# Patient Record
Sex: Male | Born: 1960 | Race: White | Hispanic: No | Smoking: Former smoker
Health system: Southern US, Community
[De-identification: ages and names within clinical notes are randomized; demographics above are authoritative.]

## PROBLEM LIST (undated history)

## (undated) DIAGNOSIS — J439 Emphysema, unspecified: Secondary | ICD-10-CM

## (undated) DIAGNOSIS — G4730 Sleep apnea, unspecified: Secondary | ICD-10-CM

## (undated) DIAGNOSIS — S42409A Unspecified fracture of lower end of unspecified humerus, initial encounter for closed fracture: Secondary | ICD-10-CM

## (undated) DIAGNOSIS — E785 Hyperlipidemia, unspecified: Secondary | ICD-10-CM

## (undated) DIAGNOSIS — K7460 Unspecified cirrhosis of liver: Secondary | ICD-10-CM

## (undated) DIAGNOSIS — K635 Polyp of colon: Secondary | ICD-10-CM

## (undated) DIAGNOSIS — I10 Essential (primary) hypertension: Secondary | ICD-10-CM

## (undated) DIAGNOSIS — E119 Type 2 diabetes mellitus without complications: Secondary | ICD-10-CM

## (undated) DIAGNOSIS — E079 Disorder of thyroid, unspecified: Secondary | ICD-10-CM

## (undated) DIAGNOSIS — M549 Dorsalgia, unspecified: Secondary | ICD-10-CM

## (undated) HISTORY — DX: Emphysema, unspecified: J43.9

## (undated) HISTORY — DX: Polyp of colon: K63.5

## (undated) HISTORY — DX: Disorder of thyroid, unspecified: E07.9

## (undated) HISTORY — DX: Unspecified cirrhosis of liver: K74.60

## (undated) HISTORY — DX: Sleep apnea, unspecified: G47.30

## (undated) HISTORY — DX: Hyperlipidemia, unspecified: E78.5

## (undated) HISTORY — DX: Essential (primary) hypertension: I10

## (undated) HISTORY — DX: Dorsalgia, unspecified: M54.9

## (undated) HISTORY — DX: Type 2 diabetes mellitus without complications: E11.9

## (undated) HISTORY — DX: Unspecified fracture of lower end of unspecified humerus, initial encounter for closed fracture: S42.409A

---
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
## 1991-11-27 DIAGNOSIS — S42409A Unspecified fracture of lower end of unspecified humerus, initial encounter for closed fracture: Secondary | ICD-10-CM

## 1991-11-27 HISTORY — DX: Unspecified fracture of lower end of unspecified humerus, initial encounter for closed fracture: S42.409A

## 2004-02-28 ENCOUNTER — Other Ambulatory Visit: Payer: Self-pay

## 2004-11-27 ENCOUNTER — Emergency Department: Payer: Self-pay | Admitting: Emergency Medicine

## 2006-10-01 ENCOUNTER — Emergency Department: Payer: Self-pay | Admitting: Emergency Medicine

## 2008-11-26 DIAGNOSIS — K7460 Unspecified cirrhosis of liver: Secondary | ICD-10-CM

## 2008-11-26 HISTORY — DX: Unspecified cirrhosis of liver: K74.60

## 2009-05-04 ENCOUNTER — Emergency Department: Payer: Self-pay | Admitting: Emergency Medicine

## 2009-07-04 ENCOUNTER — Emergency Department: Payer: Self-pay | Admitting: Emergency Medicine

## 2012-08-18 ENCOUNTER — Other Ambulatory Visit: Payer: Self-pay

## 2012-08-18 LAB — HEPATIC FUNCTION PANEL A (ARMC)
Albumin: 4.2 g/dL (ref 3.4–5.0)
Bilirubin, Direct: 0.2 mg/dL (ref 0.00–0.20)
Bilirubin,Total: 0.6 mg/dL (ref 0.2–1.0)
Total Protein: 7.8 g/dL (ref 6.4–8.2)

## 2012-08-18 LAB — PROTIME-INR: INR: 0.9

## 2013-01-02 ENCOUNTER — Emergency Department: Payer: Self-pay | Admitting: Emergency Medicine

## 2013-11-26 HISTORY — PX: OTHER SURGICAL HISTORY: SHX169

## 2013-11-26 HISTORY — PX: COLONOSCOPY: SHX174

## 2014-02-24 DIAGNOSIS — K635 Polyp of colon: Secondary | ICD-10-CM

## 2014-02-24 HISTORY — DX: Polyp of colon: K63.5

## 2014-04-22 ENCOUNTER — Encounter: Payer: Self-pay | Admitting: Nurse Practitioner

## 2014-04-26 ENCOUNTER — Encounter: Payer: Self-pay | Admitting: Nurse Practitioner

## 2014-07-12 ENCOUNTER — Emergency Department: Payer: Self-pay | Admitting: Emergency Medicine

## 2014-07-12 LAB — URINALYSIS, COMPLETE
BLOOD: NEGATIVE
Bacteria: NONE SEEN
Bilirubin,UR: NEGATIVE
Glucose,UR: >=500 mg/dL (ref 0–75)
Ketone: NEGATIVE
Leukocyte Esterase: NEGATIVE
Nitrite: NEGATIVE
PROTEIN: NEGATIVE
Ph: 5 (ref 4.5–8.0)
Specific Gravity: 1.018 (ref 1.003–1.030)
Squamous Epithelial: <1
WBC UR: <1 /HPF (ref 0–5)

## 2014-07-12 LAB — COMPREHENSIVE METABOLIC PANEL
ALBUMIN: 3.6 g/dL (ref 3.4–5.0)
AST: 27 U/L (ref 15–37)
Alkaline Phosphatase: 80 U/L
Anion Gap: 8 (ref 7–16)
BILIRUBIN TOTAL: 0.4 mg/dL (ref 0.2–1.0)
BUN: 17 mg/dL (ref 7–18)
CREATININE: 0.96 mg/dL (ref 0.60–1.30)
Calcium, Total: 9 mg/dL (ref 8.5–10.1)
Chloride: 103 mmol/L (ref 98–107)
Co2: 26 mmol/L (ref 21–32)
Glucose: 195 mg/dL — ABNORMAL HIGH (ref 65–99)
Osmolality: 281 (ref 275–301)
Potassium: 4.1 mmol/L (ref 3.5–5.1)
SGPT (ALT): 22 U/L
Sodium: 137 mmol/L (ref 136–145)
Total Protein: 7.2 g/dL (ref 6.4–8.2)

## 2014-07-12 LAB — CBC
HCT: 48 % (ref 40.0–52.0)
HGB: 15.9 g/dL (ref 13.0–18.0)
MCH: 31.3 pg (ref 26.0–34.0)
MCHC: 33.2 g/dL (ref 32.0–36.0)
MCV: 94 fL (ref 80–100)
Platelet: 193 10*3/uL (ref 150–440)
RBC: 5.09 10*6/uL (ref 4.40–5.90)
RDW: 14.3 % (ref 11.5–14.5)
WBC: 8.5 10*3/uL (ref 3.8–10.6)

## 2014-07-12 LAB — LIPASE, BLOOD: Lipase: 104 U/L (ref 73–393)

## 2014-07-29 ENCOUNTER — Ambulatory Visit (INDEPENDENT_AMBULATORY_CARE_PROVIDER_SITE_OTHER): Payer: Medicaid Other | Admitting: General Surgery

## 2014-07-29 ENCOUNTER — Encounter: Payer: Self-pay | Admitting: General Surgery

## 2014-07-29 VITALS — BP 138/80 | HR 90 | Resp 16 | Ht 72.0 in | Wt 262.0 lb

## 2014-07-29 DIAGNOSIS — M791 Myalgia, unspecified site: Secondary | ICD-10-CM

## 2014-07-29 DIAGNOSIS — IMO0001 Reserved for inherently not codable concepts without codable children: Secondary | ICD-10-CM

## 2014-07-29 NOTE — Progress Notes (Signed)
Patient ID: Colin Allen., male   DOB: 04/11/1961, 53 y.o.   MRN: [id number]  Chief Complaint  Patient presents with  . Other    New Pt evaluation of gallstones    HPI Colin Allen. is a 53 y.o. male who presents for an evaluation of gallstones. He states he started having right sided lateral flank pain around 07/04/14. He originally thought it was a pulled muscle but the pain stayed constant. He did not improve with the use of local heat. He went to the ER on 07/12/14 for an evaluation. He had a CT of the abdomen/pelvis on 07/12/14. The pain is described as a stabbing pain. He doesn't associate any foods with the pain. The pain does radiate between the right side and back. The pain medication does help relieve the pain. He is unable to sleep on his right side. He states the pain is worse in the morning.  He was diagnosed with diabetes earlier this year and has yet been able to maintain sugars less than 200.    HPI  Past Medical History  Diagnosis Date  . Emphysema of lung   . Hypertension   . Hyperlipidemia   . Sleep apnea   . Fractured elbow 1993    right elbow  . Diabetes mellitus without complication   . Colon polyps 02/2014    found 3 polyps  . Back pain   . Thyroid disease   . Cirrhosis of liver 2010    Past Surgical History  Procedure Laterality Date  . Cyst on wrist  2015  . Colonoscopy  2015  . Leg surgery Left 1968    rod was placed to correct his foot    Family History  Problem Relation Age of Onset  . Cancer Father     lung  . Cancer Maternal Grandmother     unknown  . Cancer Paternal Grandmother     unknown  . Cancer Paternal Grandfather     unsure if it was colon or prostate    Social History History  Substance Use Topics  . Smoking status: Former Smoker -- 1.00 packs/day for 35 years    Types: Cigarettes  . Smokeless tobacco: Not on file  . Alcohol Use: No    Allergies  Allergen Reactions  . Bee Venom Swelling    Current  Outpatient Prescriptions  Medication Sig Dispense Refill  . budesonide-formoterol (SYMBICORT) 160-4.5 MCG/ACT inhaler Inhale 1 puff into the lungs 2 (two) times daily.      Marland Kitchen gemfibrozil (LOPID) 600 MG tablet Take 600 mg by mouth 2 (two) times daily before a meal.      . Insulin Aspart Prot & Aspart (NOVOLOG MIX 70/30 [place]) Inject 35 Units into the skin daily.      Marland Kitchen levothyroxine (SYNTHROID, LEVOTHROID) 200 MCG tablet Take 200 mcg by mouth daily before breakfast.      . Linaclotide (LINZESS) 290 MCG CAPS capsule Take 290 mcg by mouth daily.      Marland Kitchen lisinopril (PRINIVIL,ZESTRIL) 10 MG tablet Take 10 mg by mouth daily.      Marland Kitchen oxyCODONE-acetaminophen (PERCOCET) 7.5-325 MG per tablet Take 1 tablet by mouth every 6 (six) hours as needed for pain.      Marland Kitchen zolpidem (AMBIEN) 10 MG tablet Take 10 mg by mouth at bedtime as needed for sleep.       No current facility-administered medications for this visit.    Review of Systems Review of Systems  Constitutional:  Negative.   Respiratory: Negative.   Cardiovascular: Negative.   Gastrointestinal: Negative.     Blood pressure 138/80, pulse 90, resp. rate 16, height 6' (1.829 m), weight 262 lb (118.842 kg).  Physical Exam Physical Exam  Constitutional: He is oriented to person, place, and time. He appears well-developed and well-nourished.  Cardiovascular: Normal rate, regular rhythm and normal heart sounds.   No murmur heard. Pulmonary/Chest: Effort normal and breath sounds normal.  Abdominal: Soft. Normal appearance and bowel sounds are normal. There is tenderness (located in the right flank).  Neurological: He is alert and oriented to person, place, and time.  Skin: Skin is warm and dry.    Data Reviewed CT scan of July 12, 2014 showed calcified gallstones. Special attention to the right lateral abdominal wall shows no definable defect.  Assessment    Musculoskeletal pain.  Asymptomatic cholelithiasis.     Plan    No indication for  cholecystectomy at this time, especially with the patient with poorly controlled diabetes.  Conservative measures with local heat and time has been recommended.  All narcotic medications will need to be prescribed to his PCP.      PCP and Ref. MD: Dr. Nicky Pugh  Robert Bellow 07/30/2014, 6:51 PM

## 2014-07-29 NOTE — Patient Instructions (Signed)
Patient advised to use anti-inflammatory such as Ibuprofen for pain. He is also advised that he can use a heating pad to help with pain. The patient is aware to call back for any questions or concerns.

## 2014-07-30 DIAGNOSIS — M791 Myalgia, unspecified site: Secondary | ICD-10-CM | POA: Insufficient documentation

## 2017-12-30 ENCOUNTER — Other Ambulatory Visit: Payer: Self-pay | Admitting: Specialist

## 2017-12-30 DIAGNOSIS — J449 Chronic obstructive pulmonary disease, unspecified: Secondary | ICD-10-CM

## 2017-12-30 DIAGNOSIS — R0602 Shortness of breath: Secondary | ICD-10-CM

## 2017-12-30 DIAGNOSIS — J310 Chronic rhinitis: Secondary | ICD-10-CM

## 2018-01-22 ENCOUNTER — Ambulatory Visit: Payer: Self-pay | Attending: Specialist

## 2018-02-07 ENCOUNTER — Ambulatory Visit
Admission: RE | Admit: 2018-02-07 | Discharge: 2018-02-07 | Disposition: A | Discharge status: Home / Self Care | Payer: Medicare Other | Source: Ambulatory Visit | Attending: Specialist | Admitting: Specialist

## 2018-02-07 DIAGNOSIS — R0602 Shortness of breath: Secondary | ICD-10-CM | POA: Insufficient documentation

## 2018-02-07 DIAGNOSIS — J310 Chronic rhinitis: Secondary | ICD-10-CM | POA: Diagnosis present

## 2018-02-07 DIAGNOSIS — K8020 Calculus of gallbladder without cholecystitis without obstruction: Secondary | ICD-10-CM | POA: Insufficient documentation

## 2018-02-07 DIAGNOSIS — K760 Fatty (change of) liver, not elsewhere classified: Secondary | ICD-10-CM | POA: Insufficient documentation

## 2018-02-07 DIAGNOSIS — J449 Chronic obstructive pulmonary disease, unspecified: Secondary | ICD-10-CM | POA: Diagnosis present

## 2019-02-28 IMAGING — CT CT CHEST W/O CM
2 of 3 series · 15 of 36 positions shown, 18 images · non-contrast
Comparison: None.

CLINICAL DATA: Shortness of breath

EXAM:
CT CHEST WITHOUT CONTRAST
TECHNIQUE: Multidetector CT imaging of the chest was performed following the
standard protocol without IV contrast.

[Series 2: thorax · axial · 0.75mm/px · z∈[-570,-254]mm · 12 of 186 slices shown, 15 images]
[im 14/186  mediastinal]
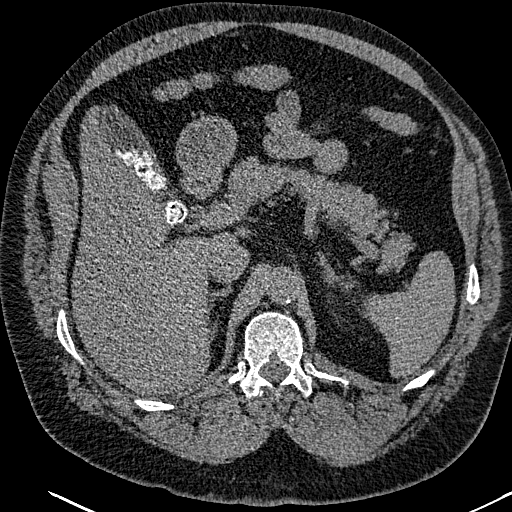
[im 14/186  lung]
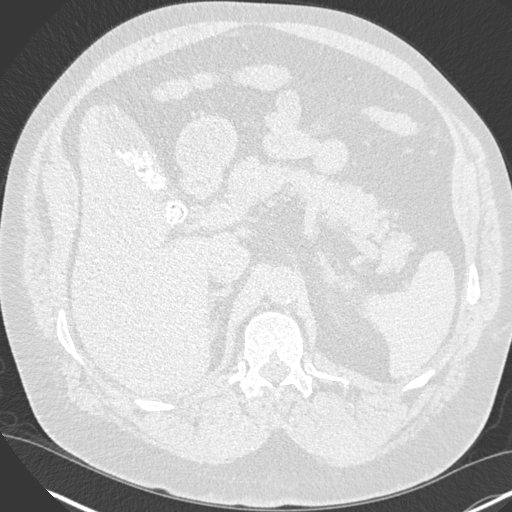
[im 28/186  lung]
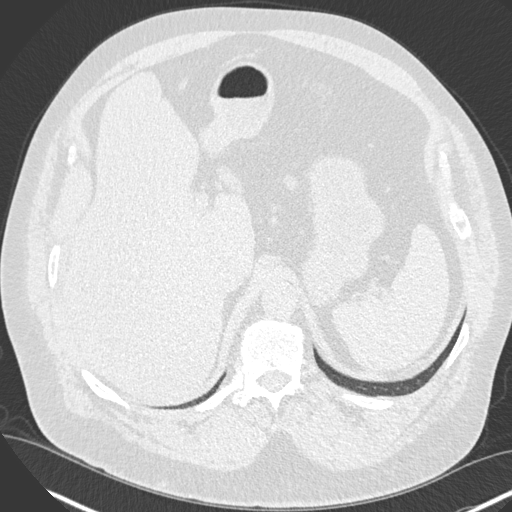
[im 42/186  lung]
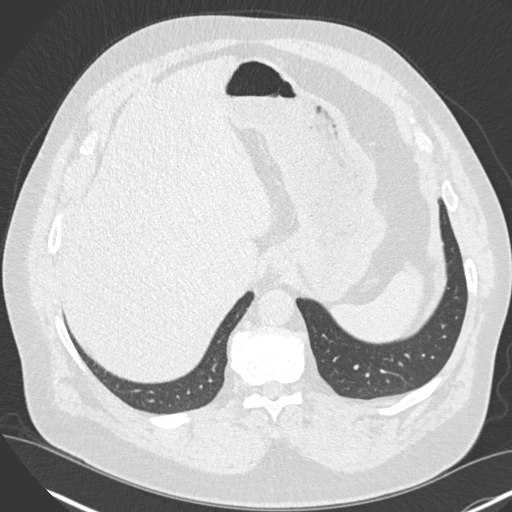
[im 55/186  lung]
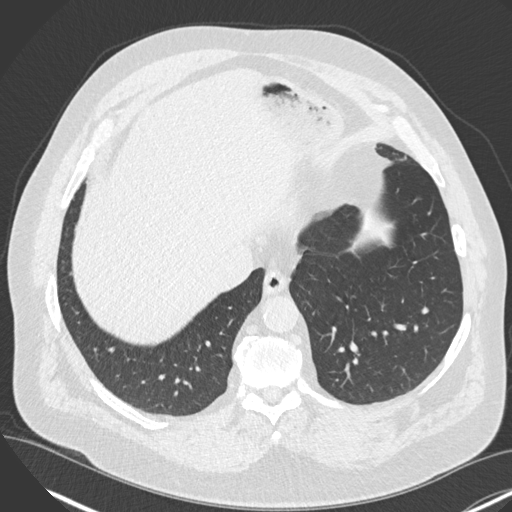
[im 69/186  mediastinal]
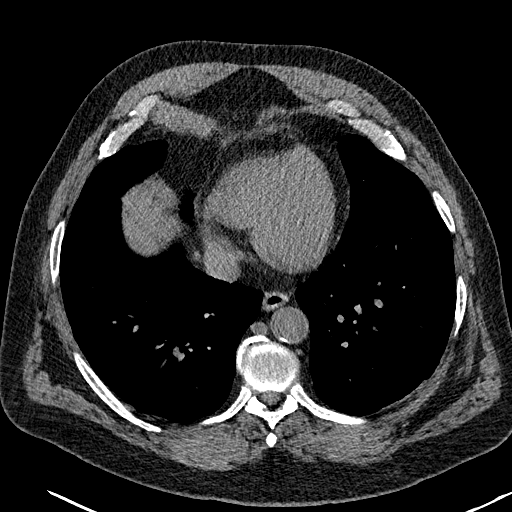
[im 69/186  lung]
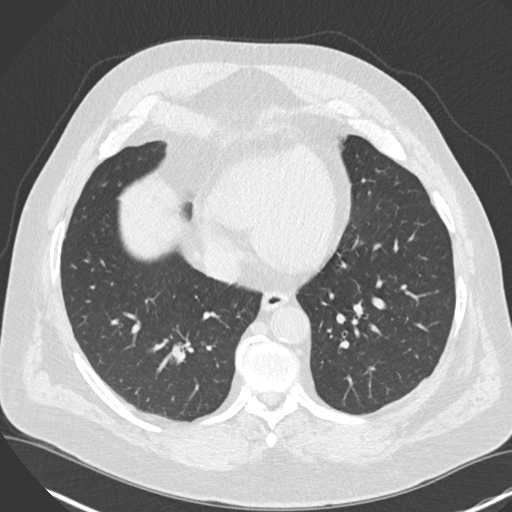
[im 83/186  lung]
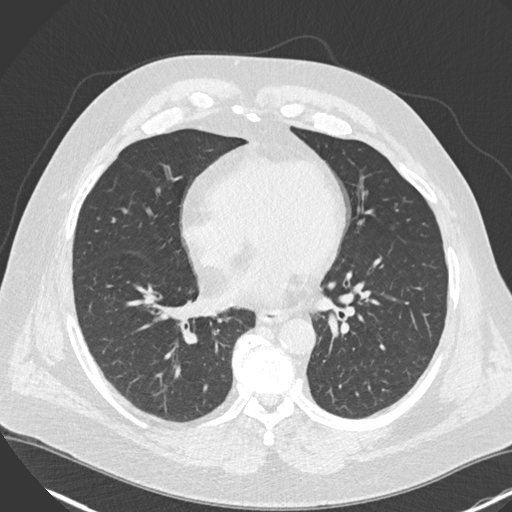
[im 103/186  lung]
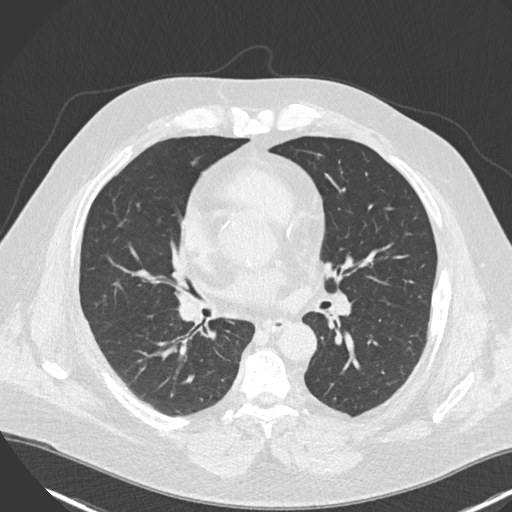
[im 117/186  lung]
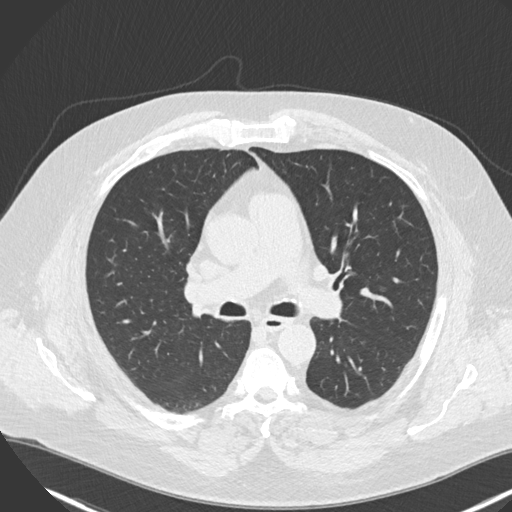
[im 131/186  mediastinal]
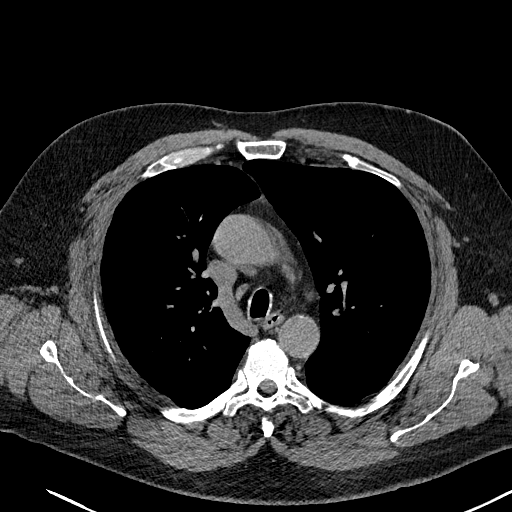
[im 131/186  lung]
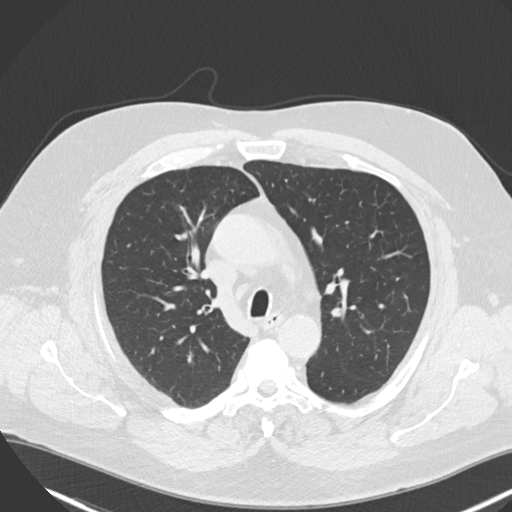
[im 144/186  lung]
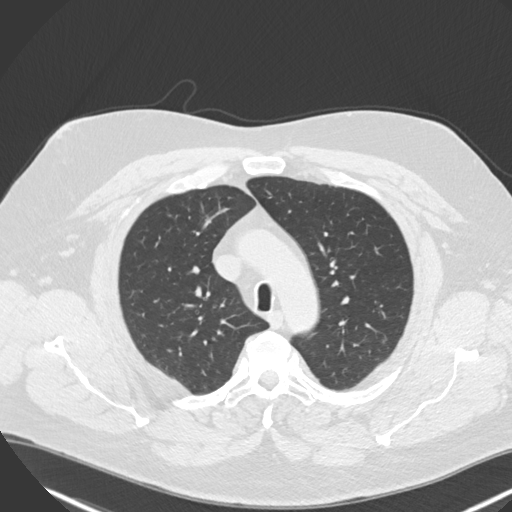
[im 158/186  lung]
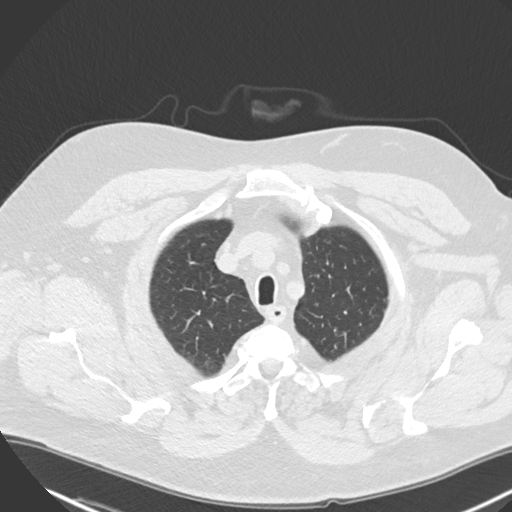
[im 172/186  lung]
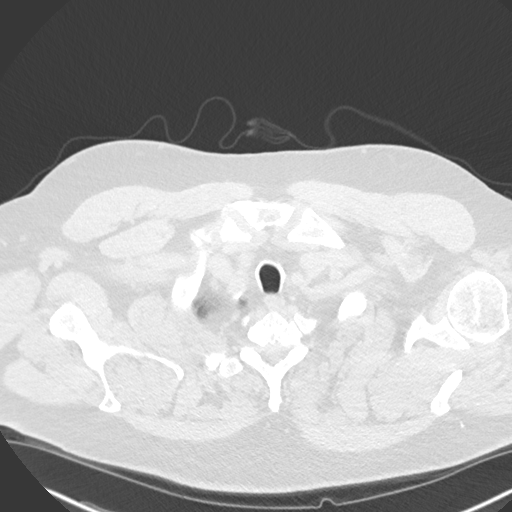

[Series 5: coronal · coronal · 0.74mm/px · 3 of 192 slices shown]
[im 39/192  lung]
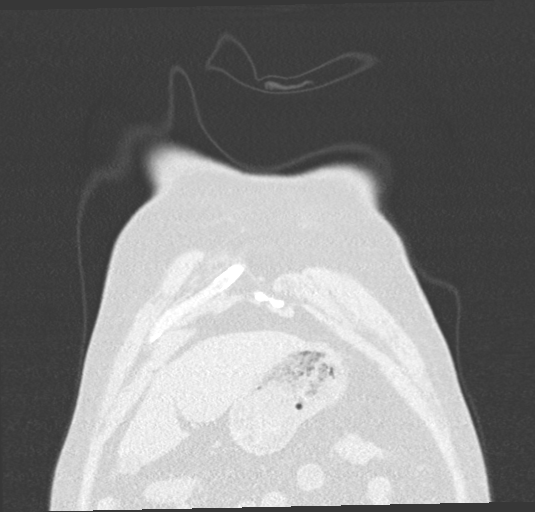
[im 77/192  lung]
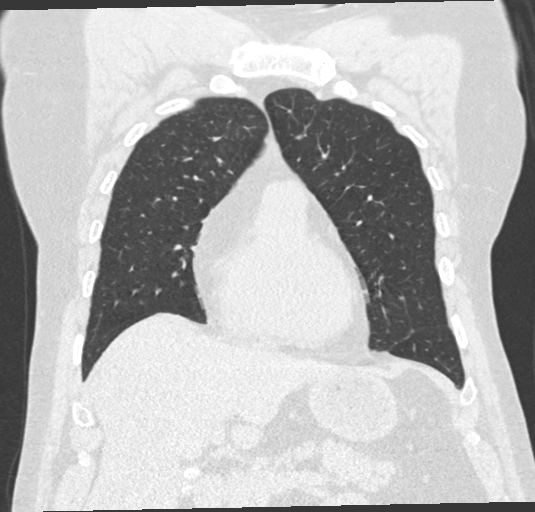
[im 115/192  lung]
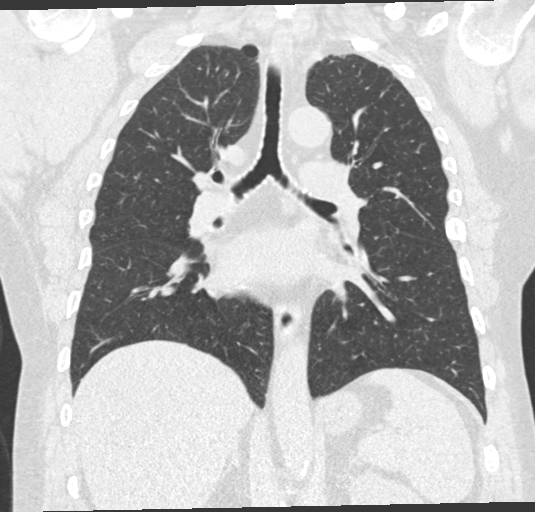

[15 of 36 positions shown; findings below may reference images not displayed]

FINDINGS: Cardiovascular: The heart size is normal. No pericardial effusion.
Coronary artery calcification is evident. Atherosclerotic
calcification is noted in the wall of the thoracic aorta.

Mediastinum/Nodes: No mediastinal lymphadenopathy. There is no hilar
lymphadenopathy. The esophagus has normal imaging features. There is
no axillary lymphadenopathy.

Lungs/Pleura: No suspicious pulmonary nodule or mass. No focal
airspace consolidation. No pulmonary edema or pleural effusion.

Upper Abdomen: Calcified gallstones measure up to about 16 mm
diameter. The liver shows diffusely decreased attenuation suggesting
steatosis.

Musculoskeletal: Bone windows reveal no worrisome lytic or sclerotic
osseous lesions.
IMPRESSION: No acute findings in the chest to explain this patient's history of
shortness of breath.

Cholelithiasis.

Hepatic steatosis.

## 2019-11-05 ENCOUNTER — Other Ambulatory Visit: Payer: Self-pay | Admitting: Endocrinology

## 2019-11-05 ENCOUNTER — Ambulatory Visit: Payer: Medicare Other

## 2019-11-05 DIAGNOSIS — K565 Intestinal adhesions [bands], unspecified as to partial versus complete obstruction: Secondary | ICD-10-CM

## 2019-11-05 DIAGNOSIS — R109 Unspecified abdominal pain: Secondary | ICD-10-CM

## 2019-11-05 DIAGNOSIS — R140 Abdominal distension (gaseous): Secondary | ICD-10-CM

## 2019-11-18 ENCOUNTER — Ambulatory Visit
Admission: RE | Admit: 2019-11-18 | Discharge: 2019-11-18 | Disposition: A | Discharge status: Home / Self Care | Payer: Medicare Other | Source: Ambulatory Visit | Attending: Endocrinology | Admitting: Endocrinology

## 2019-11-18 ENCOUNTER — Other Ambulatory Visit: Payer: Self-pay

## 2019-11-18 DIAGNOSIS — R109 Unspecified abdominal pain: Secondary | ICD-10-CM | POA: Insufficient documentation

## 2019-11-18 DIAGNOSIS — R140 Abdominal distension (gaseous): Secondary | ICD-10-CM

## 2019-11-18 DIAGNOSIS — K565 Intestinal adhesions [bands], unspecified as to partial versus complete obstruction: Secondary | ICD-10-CM

## 2019-11-18 MED ORDER — IOHEXOL 300 MG/ML  SOLN
100.0000 mL | Freq: Once | INTRAMUSCULAR | Status: AC | PRN
  Administered 2019-11-18: 100 mL via INTRAVENOUS

## 2021-10-03 ENCOUNTER — Emergency Department
Admission: EM | Admit: 2021-10-03 | Discharge: 2021-10-04 | Disposition: A | Discharge status: Other Acute Inpatient Hospital | Payer: Medicare Other | Attending: Emergency Medicine | Admitting: Emergency Medicine

## 2021-10-03 ENCOUNTER — Other Ambulatory Visit: Payer: Self-pay

## 2021-10-03 ENCOUNTER — Emergency Department: Payer: Medicare Other

## 2021-10-03 ENCOUNTER — Encounter: Payer: Self-pay | Admitting: Emergency Medicine

## 2021-10-03 DIAGNOSIS — Z87891 Personal history of nicotine dependence: Secondary | ICD-10-CM | POA: Diagnosis not present

## 2021-10-03 DIAGNOSIS — E119 Type 2 diabetes mellitus without complications: Secondary | ICD-10-CM | POA: Diagnosis not present

## 2021-10-03 DIAGNOSIS — Z79899 Other long term (current) drug therapy: Secondary | ICD-10-CM | POA: Diagnosis not present

## 2021-10-03 DIAGNOSIS — R531 Weakness: Secondary | ICD-10-CM

## 2021-10-03 DIAGNOSIS — I959 Hypotension, unspecified: Secondary | ICD-10-CM | POA: Diagnosis not present

## 2021-10-03 DIAGNOSIS — R17 Unspecified jaundice: Secondary | ICD-10-CM | POA: Diagnosis not present

## 2021-10-03 DIAGNOSIS — I10 Essential (primary) hypertension: Secondary | ICD-10-CM | POA: Insufficient documentation

## 2021-10-03 DIAGNOSIS — Z20822 Contact with and (suspected) exposure to covid-19: Secondary | ICD-10-CM | POA: Insufficient documentation

## 2021-10-03 DIAGNOSIS — R7989 Other specified abnormal findings of blood chemistry: Secondary | ICD-10-CM

## 2021-10-03 DIAGNOSIS — A419 Sepsis, unspecified organism: Secondary | ICD-10-CM | POA: Insufficient documentation

## 2021-10-03 LAB — CBC WITH DIFFERENTIAL/PLATELET
Abs Immature Granulocytes: 0.75 10*3/uL — ABNORMAL HIGH (ref 0.00–0.07)
Basophils Absolute: 0.1 10*3/uL (ref 0.0–0.1)
Basophils Relative: 0 %
Eosinophils Absolute: 0 10*3/uL (ref 0.0–0.5)
Eosinophils Relative: 0 %
HCT: 33.5 % — ABNORMAL LOW (ref 39.0–52.0)
Hemoglobin: 11.3 g/dL — ABNORMAL LOW (ref 13.0–17.0)
Immature Granulocytes: 3 %
Lymphocytes Relative: 5 %
Lymphs Abs: 1.3 10*3/uL (ref 0.7–4.0)
MCH: 32.1 pg (ref 26.0–34.0)
MCHC: 33.7 g/dL (ref 30.0–36.0)
MCV: 95.2 fL (ref 80.0–100.0)
Monocytes Absolute: 0.9 10*3/uL (ref 0.1–1.0)
Monocytes Relative: 3 %
Neutro Abs: 25.5 10*3/uL — ABNORMAL HIGH (ref 1.7–7.7)
Neutrophils Relative %: 89 %
Platelets: 190 10*3/uL (ref 150–400)
RBC: 3.52 MIL/uL — ABNORMAL LOW (ref 4.22–5.81)
RDW: 15.7 % — ABNORMAL HIGH (ref 11.5–15.5)
WBC: 28.5 10*3/uL — ABNORMAL HIGH (ref 4.0–10.5)
nRBC: 0 % (ref 0.0–0.2)

## 2021-10-03 LAB — TROPONIN I (HIGH SENSITIVITY)
Troponin I (High Sensitivity): 69 ng/L — ABNORMAL HIGH (ref <18)
Troponin I (High Sensitivity): 74 ng/L — ABNORMAL HIGH (ref <18)

## 2021-10-03 LAB — RESP PANEL BY RT-PCR (FLU A&B, COVID) ARPGX2
Influenza A by PCR: NEGATIVE
Influenza B by PCR: NEGATIVE
SARS Coronavirus 2 by RT PCR: NEGATIVE

## 2021-10-03 LAB — PROTIME-INR
INR: 1.5 — ABNORMAL HIGH (ref 0.8–1.2)
Prothrombin Time: 17.9 seconds — ABNORMAL HIGH (ref 11.4–15.2)

## 2021-10-03 LAB — LIPASE, BLOOD: Lipase: 35 U/L (ref 11–51)

## 2021-10-03 LAB — APTT: aPTT: 31 seconds (ref 24–36)

## 2021-10-03 LAB — AMMONIA: Ammonia: 51 umol/L — ABNORMAL HIGH (ref 9–35)

## 2021-10-03 LAB — BRAIN NATRIURETIC PEPTIDE: B Natriuretic Peptide: 88.7 pg/mL (ref 0.0–100.0)

## 2021-10-03 LAB — CK: Total CK: 1913 U/L — ABNORMAL HIGH (ref 49–397)

## 2021-10-03 LAB — LACTIC ACID, PLASMA: Lactic Acid, Venous: 1.8 mmol/L (ref 0.5–1.9)

## 2021-10-03 MED ORDER — SODIUM CHLORIDE 0.9 % IV BOLUS (SEPSIS)
1000.0000 mL | Freq: Once | INTRAVENOUS | Status: AC
  Administered 2021-10-03: 1000 mL via INTRAVENOUS

## 2021-10-03 MED ORDER — LACTATED RINGERS IV BOLUS
1000.0000 mL | Freq: Once | INTRAVENOUS | Status: AC
  Administered 2021-10-03: 1000 mL via INTRAVENOUS

## 2021-10-03 MED ORDER — METRONIDAZOLE 500 MG/100ML IV SOLN
500.0000 mg | Freq: Once | INTRAVENOUS | Status: AC
  Administered 2021-10-03: 500 mg via INTRAVENOUS
  Filled 2021-10-03: qty 100

## 2021-10-03 MED ORDER — LACTATED RINGERS IV SOLN: INTRAVENOUS | Status: DC

## 2021-10-03 MED ORDER — SODIUM CHLORIDE 0.9 % IV SOLN
2.0000 g | Freq: Once | INTRAVENOUS | Status: AC
  Administered 2021-10-03: 2 g via INTRAVENOUS
  Filled 2021-10-03: qty 2

## 2021-10-03 MED ORDER — ALBUMIN HUMAN 5 % IV SOLN
25.0000 g | Freq: Once | INTRAVENOUS | Status: AC
  Administered 2021-10-04: 25 g via INTRAVENOUS
  Filled 2021-10-03: qty 500

## 2021-10-03 MED ORDER — VANCOMYCIN HCL IN DEXTROSE 1-5 GM/200ML-% IV SOLN
1000.0000 mg | Freq: Once | INTRAVENOUS | Status: AC
  Administered 2021-10-03: 1000 mg via INTRAVENOUS
  Filled 2021-10-03: qty 200

## 2021-10-03 MED ORDER — BACITRACIN-NEOMYCIN-POLYMYXIN 400-5-5000 EX OINT
TOPICAL_OINTMENT | CUTANEOUS | Status: AC
  Administered 2021-10-03: 1
  Filled 2021-10-03: qty 1

## 2021-10-03 NOTE — Consult Note (Signed)
PHARMACY -  BRIEF ANTIBIOTIC NOTE   Pharmacy has received consult(s) for sepsis from an ED provider.  The patient's profile has been reviewed for ht/wt/allergies/indication/available labs.    One time order(s) placed for cefepime, vancomycin, flagyl  Further antibiotics/pharmacy consults should be ordered by admitting physician if indicated.                       Thank you, Oswald Hillock 10/03/2021  6:40 PM

## 2021-10-03 NOTE — ED Notes (Signed)
Date and time results received: 10/03/21 1829 (use smartphrase ".now" to insert current time)  Test: Total Bili  Critical Value: 29.6  Name of Provider Notified: Dr. Cinda Quest  Orders Received? Or Actions Taken?: see chart

## 2021-10-03 NOTE — ED Provider Notes (Addendum)
[facility] Emergency Department Provider Note   ____________________________________________   Event Date/Time   First MD Initiated Contact with Patient 10/03/21 1645     (approximate)  I have reviewed the triage vital signs and the nursing notes.   HISTORY  Chief Complaint Weakness    HPI Colin Allen. is a 60 y.o. male who family had not heard from him for 3 days so they called EMS.  EMS got there.  He apparently had fallen and was on the floor for an unknown length of time.  He is now very jaundiced which apparently is new.  His blood pressure is low.  He has a history of cirrhosis.  He has a history of diabetes hypertension and multiple other problems as listed in HPI.  He denies any fever nausea vomiting or pain anywhere.  He does have a skin tear on his left arm which was dressed and dressed had a little bit of pus but the wound itself looks good and there is no surrounding cellulitis. Patient's labs are abnormal there are no old labs that we have since 20 21.  Those labs are essentially within normal limits.     Past Medical History:  Diagnosis Date   Back pain    Cirrhosis of liver ([place]) 2010   Colon polyps 02/2014   found 3 polyps   Diabetes mellitus without complication ([place])    Emphysema of lung ([place])    Fractured elbow 1993   right elbow   Hyperlipidemia    Hypertension    Sleep apnea    Thyroid disease     Patient Active Problem List   Diagnosis Date Noted   Muscle pain 07/30/2014    Past Surgical History:  Procedure Laterality Date   COLONOSCOPY  2015   cyst on wrist  2015   LEG SURGERY Left 1968   rod was placed to correct his foot    Prior to Admission medications   Medication Sig Start Date End Date Taking? Authorizing Provider  budesonide-formoterol (SYMBICORT) 160-4.5 MCG/ACT inhaler Inhale 1 puff into the lungs 2 (two) times daily.    [provider]  gemfibrozil (LOPID) 600 MG tablet Take 600 mg by  mouth 2 (two) times daily before a meal.    [provider]  Insulin Aspart Prot & Aspart (NOVOLOG MIX 70/30 [place]) Inject 35 Units into the skin daily.    [provider]  levothyroxine (SYNTHROID, LEVOTHROID) 200 MCG tablet Take 200 mcg by mouth daily before breakfast.    [provider]  Linaclotide (LINZESS) 290 MCG CAPS capsule Take 290 mcg by mouth daily.    [provider]  lisinopril (PRINIVIL,ZESTRIL) 10 MG tablet Take 10 mg by mouth daily.    [provider]  oxyCODONE-acetaminophen (PERCOCET) 7.5-325 MG per tablet Take 1 tablet by mouth every 6 (six) hours as needed for pain.    [provider]  zolpidem (AMBIEN) 10 MG tablet Take 10 mg by mouth at bedtime as needed for sleep.    [provider]    Allergies Bee venom  Family History  Problem Relation Age of Onset   Cancer Father        lung   Cancer Maternal Grandmother        unknown   Cancer Paternal Grandmother        unknown   Cancer Paternal Grandfather        unsure if it was colon or prostate  Social History Social History   Tobacco Use   Smoking status: Former    Packs/day: 1.00    Years: 35.00    Pack years: 35.00    Types: Cigarettes  Substance Use Topics   Alcohol use: No   Drug use: No    Review of Systems  Constitutional: No fever/chills Eyes: No visual changes. ENT: No sore throat. Cardiovascular: Denies chest pain. Respiratory: Denies shortness of breath. Gastrointestinal: No abdominal pain.  No nausea, no vomiting.  No diarrhea.  No constipation. Genitourinary: Negative for dysuria. Musculoskeletal: Negative for back pain. Skin: Negative for rash. Neurological: Negative for headaches, focal weakness or numbness.  ____________________________________________   PHYSICAL EXAM:  VITAL SIGNS: ED Triage Vitals  Enc Vitals Group     BP 10/03/21 1829 (!) 67/45     Pulse Rate 10/03/21 1829 77     Resp 10/03/21 1713 19      Temp 10/03/21 1713 98.5 F (36.9 C)     Temp Source 10/03/21 1713 Oral     SpO2 10/03/21 1713 99 %     Weight --      Height --      Head Circumference --      Peak Flow --      Pain Score --      Pain Loc --      Pain Edu? --      Excl. in [place]? --    Constitutional: Alert and oriented. Well appearing and in no acute distress. Eyes: Conjunctivae are jaundiced PERRL. EOMI. Head: Atraumatic. Nose: No congestion/rhinnorhea. Mouth/Throat: Mucous membranes are moist.  Oropharynx non-erythematous. Neck: No stridor.  Cardiovascular: Normal rate, regular rhythm. Grossly normal heart sounds.  Good peripheral circulation. Respiratory: Normal respiratory effort.  No retractions. Lungs CTAB. Gastrointestinal: Soft and nontender. No distention. No abdominal bruits.  Musculoskeletal: No lower extremity tenderness nor edema.  Patient has bruising in multiple areas and a skin tear on the left forearm dorsal surface distally.  It is not infected looking although the dressing had pus on it when it was removed. Neurologic:  Normal speech and language. No gross focal neurologic deficits are appreciated.  Skin:  Skin is warm, dry and intact except for the skin tear as noted above.  He has a lot of bruising.   ____________________________________________   LABS (all labs ordered are listed, but only abnormal results are displayed)  Labs Reviewed  COMPREHENSIVE METABOLIC PANEL - Abnormal; Notable for the following components:      Result Value   Sodium 133 (*)    Chloride 97 (*)    CO2 19 (*)    Glucose, Bld 157 (*)    BUN 63 (*)    Calcium 7.8 (*)    Albumin 1.8 (*)    AST 561 (*)    ALT 177 (*)    Alkaline Phosphatase 501 (*)    Total Bilirubin 29.6 (*)    Anion gap 17 (*)    All other components within normal limits  CBC WITH DIFFERENTIAL/PLATELET - Abnormal; Notable for the following components:   WBC 28.5 (*)    RBC 3.52 (*)    Hemoglobin 11.3 (*)    HCT 33.5 (*)    RDW 15.7 (*)     Neutro Abs 25.5 (*)    Abs Immature Granulocytes 0.75 (*)    All other components within normal limits  PROTIME-INR - Abnormal; Notable for the following components:   Prothrombin Time 17.9 (*)    INR 1.5 (*)  All other components within normal limits  TROPONIN I (HIGH SENSITIVITY) - Abnormal; Notable for the following components:   Troponin I (High Sensitivity) 69 (*)    All other components within normal limits  CULTURE, BLOOD (SINGLE)  BRAIN NATRIURETIC PEPTIDE  APTT  LIPASE, BLOOD  LACTIC ACID, PLASMA  LACTIC ACID, PLASMA  URINALYSIS, ROUTINE W REFLEX MICROSCOPIC  AMMONIA  CK  TROPONIN I (HIGH SENSITIVITY)   ____________________________________________  EKG  EKG read interpreted by me shows normal sinus rhythm rate of 74 normal axis no acute ST-T wave changes QT interval is prolonged QTC is 506 ms ____________________________________________  RADIOLOGY Gertha Calkin, personally viewed and evaluated these images (plain radiographs) as part of my medical decision making, as well as reviewing the written report by the radiologist.  ED MD interpretation: Chest x-ray read by radiology reviewed by me shows no obvious acute disease.  Official radiology report(s): CT ABDOMEN PELVIS WO CONTRAST  Result Date: 10/03/2021 CLINICAL DATA:  Liver masses on right upper quadrant ultrasound. Flank pain with abnormal LFTs. EXAM: CT ABDOMEN AND PELVIS WITHOUT CONTRAST TECHNIQUE: Multidetector CT imaging of the abdomen and pelvis was performed following the standard protocol without IV contrast. COMPARISON:  Right upper quadrant ultrasound dated 10/03/2021. CT abdomen dated 11/18/2019. CT abdomen/pelvis dated 07/12/2014. FINDINGS: Lower chest: Trace right pleural effusion with mild bibasilar atelectasis. Hepatobiliary: Nodular hepatic contour. Innumerable hepatic lesions, including a dominant 4.4 cm mass in segment 6 (series 2/image 40), compatible with metastasis. Suspected  cholecystectomy. No intrahepatic ductal dilatation. Multiple mid/distal CBD stones measuring up to 11 mm (series 2/image 51). Pancreas: 3.3 cm irregular mass in the pancreatic tail (series 2/image 44), worrisome for pancreatic neoplasm given the additional findings. Spleen: Within normal limits. Mass abuts but does not definitely invade the spleen at the splenic hilum (series 2/image 45). Adrenals/Urinary Tract: Adrenal glands are within normal limits. Kidneys are within normal limits. No renal calculi or hydronephrosis. Bladder is within normal limits. Stomach/Bowel: Stomach is within normal limits. No evidence of bowel obstruction. Normal appendix (series 2/image 83). Left colon is decompressed. Vascular/Lymphatic: No evidence of abdominal aortic aneurysm. Atherosclerotic calcifications of the abdominal aorta and branch vessels. Small upper abdominal lymph nodes, including a 11 mm node in the porta hepatis (series 2/image 39). Reproductive: Prostate is unremarkable. Other: Trace pelvic ascites. Musculoskeletal: Degenerative changes of the visualized thoracolumbar spine. IMPRESSION: 3.3 cm irregular mass in the pancreatic tail, worrisome for pancreatic neoplasm. Innumerable metastases, measuring up to 4.4 cm in segment 6. Suspected cholecystectomy. No intrahepatic ductal dilatation. Multiple mid/distal CBD stones measuring up to 11 mm. Electronically Signed   By: Julian Hy M.D.   On: 10/03/2021 20:40   DG Chest 2 View  Result Date: 10/03/2021 CLINICAL DATA:  Altered mental status. EXAM: CHEST - 2 VIEW COMPARISON:  Chest CT February 07, 2018 FINDINGS: Patient is rotated. The heart size and mediastinal contours are within normal limits. No focal consolidation. No pleural effusion. No pneumothorax. The visualized skeletal structures are unremarkable. IMPRESSION: No acute cardiopulmonary disease. Electronically Signed   By: Dahlia Bailiff M.D.   On: 10/03/2021 18:53   CT Head Wo Contrast  Result Date:  10/03/2021 CLINICAL DATA:  Head trauma. Mental status change. Weakness. Found on floor. EXAM: CT HEAD WITHOUT CONTRAST CT CERVICAL SPINE WITHOUT CONTRAST TECHNIQUE: Multidetector CT imaging of the head and cervical spine was performed following the standard protocol without intravenous contrast. Multiplanar CT image reconstructions of the cervical spine were also generated. COMPARISON:  None.  FINDINGS: CT HEAD FINDINGS Brain: There is no evidence of an acute infarct, intracranial hemorrhage, mass, midline shift, or extra-axial fluid collection. 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 Cerebral atrophy is mild-to-moderate for age. Vascular: Calcified atherosclerosis at the skull base. No hyperdense vessel. Skull: No fracture or suspicious osseous lesion. Sinuses/Orbits: Small volume left sphenoid sinus fluid. Clear mastoid air cells. Unremarkable orbits. Other: None. CT CERVICAL SPINE FINDINGS Alignment: Normal. Skull base and vertebrae: No acute fracture or suspicious osseous lesion. Soft tissues and spinal canal: No prevertebral fluid or swelling. No visible canal hematoma. Disc levels:  Mild cervical spondylosis and facet arthrosis. Upper chest: Mild scarring in the lung apices. Other: Moderate calcific atherosclerosis at the carotid bifurcations. IMPRESSION: 1. No evidence of acute intracranial abnormality. 2.  Cerebral Atrophy (ICD10-G31.9). 3. No acute cervical spine fracture or traumatic subluxation. Electronically Signed   By: Logan Bores M.D.   On: 10/03/2021 18:55   CT Cervical Spine Wo Contrast  Result Date: 10/03/2021 CLINICAL DATA:  Head trauma. Mental status change. Weakness. Found on floor. EXAM: CT HEAD WITHOUT CONTRAST CT CERVICAL SPINE WITHOUT CONTRAST TECHNIQUE: Multidetector CT imaging of the head and cervical spine was performed following the standard protocol without intravenous contrast. Multiplanar CT image reconstructions of the cervical spine were also generated.  COMPARISON:  None. FINDINGS: CT HEAD FINDINGS Brain: There is no evidence of an acute infarct, intracranial hemorrhage, mass, midline shift, or extra-axial fluid collection. 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 Cerebral atrophy is mild-to-moderate for age. Vascular: Calcified atherosclerosis at the skull base. No hyperdense vessel. Skull: No fracture or suspicious osseous lesion. Sinuses/Orbits: Small volume left sphenoid sinus fluid. Clear mastoid air cells. Unremarkable orbits. Other: None. CT CERVICAL SPINE FINDINGS Alignment: Normal. Skull base and vertebrae: No acute fracture or suspicious osseous lesion. Soft tissues and spinal canal: No prevertebral fluid or swelling. No visible canal hematoma. Disc levels:  Mild cervical spondylosis and facet arthrosis. Upper chest: Mild scarring in the lung apices. Other: Moderate calcific atherosclerosis at the carotid bifurcations. IMPRESSION: 1. No evidence of acute intracranial abnormality. 2.  Cerebral Atrophy (ICD10-G31.9). 3. No acute cervical spine fracture or traumatic subluxation. Electronically Signed   By: Logan Bores M.D.   On: 10/03/2021 18:55   US Abdomen Limited RUQ (LIVER/GB)  Result Date: 10/03/2021 CLINICAL DATA:  The LFTs. EXAM: ULTRASOUND ABDOMEN LIMITED RIGHT UPPER QUADRANT COMPARISON:  CT abdomen pelvis dated 11/18/2019. FINDINGS: Gallbladder: The gallbladder is not visualized and may be contracted or surgically absent. Common bile duct: Diameter: 6 mm Liver: The liver demonstrates a heterogeneous echotexture. Multiple hypoechoic masses noted throughout the liver. The largest mass measures 8.7 x 8.2 x 4.2 cm in the right lobe of the liver. Findings concerning for metastatic disease. There is slight irregularity of the liver contour which may represent changes of cirrhosis. Portal vein is patent on color Doppler imaging with normal direction of blood flow towards the liver. Other: None. IMPRESSION: Multiple liver masses  concerning for metastatic disease. CT pending. Electronically Signed   By: Anner Crete M.D.   On: 10/03/2021 20:18    ____________________________________________   PROCEDURES  Procedure(s) performed (including Critical Care): Critical care time 1 hour and 45 minutes.  This includes seeing the patient twice in the hallway and then once when he got moved into the room.  Evaluating his lab work and his old records.  Additionally I spoke with the hospitalist and discovered that GI for the  next 2 days will be unable to address this gentleman's gallstones.  This may be what is causing his sepsis.  We will have to transfer [facility] to [facility].  I then talked to Dr. Lorenso Courier at [facility].  I have explained this to the patient as well as the patient's sister.  I have also reviewed the patient's old records again.  Procedures   ____________________________________________   INITIAL IMPRESSION / ASSESSMENT AND PLAN / ED COURSE     ----------------------------------------- 8:53 PM on 10/03/2021 ----------------------------------------- Patient creatinine and CK are still pending.  The machine is down.  To be at least another hour and a half.  Patient appears to meet sepsis criteria with his  hypotension and elevated white count.  CT and ultrasound showed multiple liver mets.  I am going to give him more fluids as he is still hypotensive after 2 L and will monitor him closely.  We will try to get him in the hospital he will need at least stepdown if not intensive care.          ____________________________________________   FINAL CLINICAL IMPRESSION(S) / ED DIAGNOSES  Final diagnoses:  Jaundice  Weakness  Sepsis, due to unspecified organism, unspecified whether acute organ dysfunction present ([place])  Hypotension, unspecified hypotension type     ED Discharge Orders     None        Note:  This document was prepared using Dragon voice recognition software and may include unintentional  dictation errors.    Nena Polio, MD 10/03/21 0867    Nena Polio, MD 10/03/21 2150

## 2021-10-03 NOTE — ED Notes (Signed)
Report received from Michael, RN

## 2021-10-03 NOTE — Progress Notes (Signed)
Sepsis tracking by eLINK 

## 2021-10-03 NOTE — ED Notes (Signed)
Pt to CT

## 2021-10-03 NOTE — Sepsis Progress Note (Signed)
ELink monitoring sepsis protocol 

## 2021-10-03 NOTE — ED Notes (Signed)
US at the bedside

## 2021-10-03 NOTE — Consult Note (Addendum)
CODE SEPSIS - PHARMACY COMMUNICATION  **[facility] Spectrum Antibiotics should be administered within 1 hour of Sepsis diagnosis**  Time Code Sepsis Called/Page Received: 5750  Antibiotics Ordered: cefepime/vancomycin/flagyl  Time of 1st antibiotic administration: 1907  Additional action taken by pharmacy: none      Oswald Hillock ,PharmD Clinical Pharmacist  10/03/2021  7:09 PM

## 2021-10-03 NOTE — ED Triage Notes (Signed)
Pt from home via [facility] with reports of AMS and weakness. Pt was found in the floor by EMS. Pt unsure if he tripped and fell or lost consciousness.

## 2021-10-04 ENCOUNTER — Inpatient Hospital Stay (HOSPITAL_COMMUNITY)
Admission: RE | Admit: 2021-10-04 | Discharge: 2021-10-11 | DRG: 871 | Disposition: A | Discharge status: Hospice | Payer: Medicare Other | Source: Ambulatory Visit | Attending: Internal Medicine | Admitting: Internal Medicine

## 2021-10-04 ENCOUNTER — Inpatient Hospital Stay (HOSPITAL_COMMUNITY): Payer: Medicare Other

## 2021-10-04 DIAGNOSIS — E039 Hypothyroidism, unspecified: Secondary | ICD-10-CM

## 2021-10-04 DIAGNOSIS — K7682 Hepatic encephalopathy: Secondary | ICD-10-CM | POA: Diagnosis present

## 2021-10-04 DIAGNOSIS — K7290 Hepatic failure, unspecified without coma: Secondary | ICD-10-CM | POA: Diagnosis not present

## 2021-10-04 DIAGNOSIS — K7031 Alcoholic cirrhosis of liver with ascites: Secondary | ICD-10-CM | POA: Diagnosis not present

## 2021-10-04 DIAGNOSIS — N179 Acute kidney failure, unspecified: Secondary | ICD-10-CM | POA: Diagnosis not present

## 2021-10-04 DIAGNOSIS — F10239 Alcohol dependence with withdrawal, unspecified: Secondary | ICD-10-CM | POA: Diagnosis present

## 2021-10-04 DIAGNOSIS — E669 Obesity, unspecified: Secondary | ICD-10-CM | POA: Diagnosis present

## 2021-10-04 DIAGNOSIS — Y92009 Unspecified place in unspecified non-institutional (private) residence as the place of occurrence of the external cause: Secondary | ICD-10-CM | POA: Diagnosis not present

## 2021-10-04 DIAGNOSIS — K7460 Unspecified cirrhosis of liver: Secondary | ICD-10-CM

## 2021-10-04 DIAGNOSIS — R4182 Altered mental status, unspecified: Secondary | ICD-10-CM | POA: Diagnosis present

## 2021-10-04 DIAGNOSIS — Z809 Family history of malignant neoplasm, unspecified: Secondary | ICD-10-CM

## 2021-10-04 DIAGNOSIS — Z8673 Personal history of transient ischemic attack (TIA), and cerebral infarction without residual deficits: Secondary | ICD-10-CM

## 2021-10-04 DIAGNOSIS — E119 Type 2 diabetes mellitus without complications: Secondary | ICD-10-CM | POA: Diagnosis present

## 2021-10-04 DIAGNOSIS — G4733 Obstructive sleep apnea (adult) (pediatric): Secondary | ICD-10-CM | POA: Diagnosis present

## 2021-10-04 DIAGNOSIS — Z711 Person with feared health complaint in whom no diagnosis is made: Secondary | ICD-10-CM | POA: Diagnosis not present

## 2021-10-04 DIAGNOSIS — K8689 Other specified diseases of pancreas: Secondary | ICD-10-CM

## 2021-10-04 DIAGNOSIS — G4730 Sleep apnea, unspecified: Secondary | ICD-10-CM | POA: Diagnosis present

## 2021-10-04 DIAGNOSIS — A419 Sepsis, unspecified organism: Secondary | ICD-10-CM | POA: Diagnosis present

## 2021-10-04 DIAGNOSIS — R6521 Severe sepsis with septic shock: Secondary | ICD-10-CM | POA: Diagnosis present

## 2021-10-04 DIAGNOSIS — Z6829 Body mass index (BMI) 29.0-29.9, adult: Secondary | ICD-10-CM

## 2021-10-04 DIAGNOSIS — E7800 Pure hypercholesterolemia, unspecified: Secondary | ICD-10-CM | POA: Diagnosis present

## 2021-10-04 DIAGNOSIS — E871 Hypo-osmolality and hyponatremia: Secondary | ICD-10-CM | POA: Diagnosis present

## 2021-10-04 DIAGNOSIS — M549 Dorsalgia, unspecified: Secondary | ICD-10-CM | POA: Diagnosis present

## 2021-10-04 DIAGNOSIS — M6282 Rhabdomyolysis: Secondary | ICD-10-CM

## 2021-10-04 DIAGNOSIS — C801 Malignant (primary) neoplasm, unspecified: Secondary | ICD-10-CM | POA: Diagnosis present

## 2021-10-04 DIAGNOSIS — R0602 Shortness of breath: Secondary | ICD-10-CM

## 2021-10-04 DIAGNOSIS — C787 Secondary malignant neoplasm of liver and intrahepatic bile duct: Secondary | ICD-10-CM | POA: Diagnosis present

## 2021-10-04 DIAGNOSIS — W19XXXA Unspecified fall, initial encounter: Secondary | ICD-10-CM | POA: Diagnosis present

## 2021-10-04 DIAGNOSIS — J439 Emphysema, unspecified: Secondary | ICD-10-CM | POA: Diagnosis present

## 2021-10-04 DIAGNOSIS — Z515 Encounter for palliative care: Secondary | ICD-10-CM

## 2021-10-04 DIAGNOSIS — G9341 Metabolic encephalopathy: Secondary | ICD-10-CM | POA: Diagnosis present

## 2021-10-04 DIAGNOSIS — D6489 Other specified anemias: Secondary | ICD-10-CM | POA: Diagnosis not present

## 2021-10-04 DIAGNOSIS — Z66 Do not resuscitate: Secondary | ICD-10-CM | POA: Diagnosis not present

## 2021-10-04 DIAGNOSIS — N170 Acute kidney failure with tubular necrosis: Secondary | ICD-10-CM | POA: Diagnosis present

## 2021-10-04 DIAGNOSIS — Z87891 Personal history of nicotine dependence: Secondary | ICD-10-CM

## 2021-10-04 DIAGNOSIS — Z789 Other specified health status: Secondary | ICD-10-CM | POA: Diagnosis not present

## 2021-10-04 DIAGNOSIS — Z7189 Other specified counseling: Secondary | ICD-10-CM | POA: Diagnosis not present

## 2021-10-04 DIAGNOSIS — E876 Hypokalemia: Secondary | ICD-10-CM | POA: Diagnosis not present

## 2021-10-04 DIAGNOSIS — R52 Pain, unspecified: Secondary | ICD-10-CM | POA: Diagnosis not present

## 2021-10-04 DIAGNOSIS — L299 Pruritus, unspecified: Secondary | ICD-10-CM | POA: Diagnosis not present

## 2021-10-04 DIAGNOSIS — I10 Essential (primary) hypertension: Secondary | ICD-10-CM | POA: Diagnosis present

## 2021-10-04 DIAGNOSIS — K7030 Alcoholic cirrhosis of liver without ascites: Secondary | ICD-10-CM | POA: Diagnosis present

## 2021-10-04 LAB — COMPREHENSIVE METABOLIC PANEL
ALT: 177 U/L — ABNORMAL HIGH (ref 0–44)
ALT: 143 U/L — ABNORMAL HIGH (ref 0–44)
AST: 561 U/L — ABNORMAL HIGH (ref 15–41)
AST: 346 U/L — ABNORMAL HIGH (ref 15–41)
Albumin: 1.8 g/dL — ABNORMAL LOW (ref 3.5–5.0)
Albumin: <1.5 g/dL — ABNORMAL LOW (ref 3.5–5.0)
Alkaline Phosphatase: 501 U/L — ABNORMAL HIGH (ref 38–126)
Alkaline Phosphatase: 396 U/L — ABNORMAL HIGH (ref 38–126)
Anion gap: 17 — ABNORMAL HIGH (ref 5–15)
Anion gap: 16 — ABNORMAL HIGH (ref 5–15)
BUN: 63 mg/dL — ABNORMAL HIGH (ref 6–20)
BUN: 66 mg/dL — ABNORMAL HIGH (ref 6–20)
CO2: 19 mmol/L — ABNORMAL LOW (ref 22–32)
CO2: 15 mmol/L — ABNORMAL LOW (ref 22–32)
Calcium: 7.8 mg/dL — ABNORMAL LOW (ref 8.9–10.3)
Calcium: 6.4 mg/dL — CL (ref 8.9–10.3)
Chloride: 97 mmol/L — ABNORMAL LOW (ref 98–111)
Chloride: 101 mmol/L (ref 98–111)
Creatinine, Ser: UNDETERMINED mg/dL (ref 0.61–1.24)
Creatinine, Ser: 4.15 mg/dL — ABNORMAL HIGH (ref 0.61–1.24)
GFR, Estimated: 16 mL/min — ABNORMAL LOW (ref >60)
Glucose, Bld: 157 mg/dL — ABNORMAL HIGH (ref 70–99)
Glucose, Bld: 138 mg/dL — ABNORMAL HIGH (ref 70–99)
Potassium: 4.9 mmol/L (ref 3.5–5.1)
Potassium: 4.4 mmol/L (ref 3.5–5.1)
Sodium: 133 mmol/L — ABNORMAL LOW (ref 135–145)
Sodium: 132 mmol/L — ABNORMAL LOW (ref 135–145)
Total Bilirubin: 29.6 mg/dL (ref 0.3–1.2)
Total Bilirubin: 24.9 mg/dL (ref 0.3–1.2)
Total Protein: 6.9 g/dL (ref 6.5–8.1)
Total Protein: 5.3 g/dL — ABNORMAL LOW (ref 6.5–8.1)

## 2021-10-04 LAB — HEMOGLOBIN A1C
Hgb A1c MFr Bld: 5.8 % — ABNORMAL HIGH (ref 4.8–5.6)
Mean Plasma Glucose: 119.76 mg/dL

## 2021-10-04 LAB — GLUCOSE, CAPILLARY
Glucose-Capillary: 142 mg/dL — ABNORMAL HIGH (ref 70–99)
Glucose-Capillary: 129 mg/dL — ABNORMAL HIGH (ref 70–99)
Glucose-Capillary: 104 mg/dL — ABNORMAL HIGH (ref 70–99)
Glucose-Capillary: 102 mg/dL — ABNORMAL HIGH (ref 70–99)
Glucose-Capillary: 133 mg/dL — ABNORMAL HIGH (ref 70–99)
Glucose-Capillary: 174 mg/dL — ABNORMAL HIGH (ref 70–99)
Glucose-Capillary: 170 mg/dL — ABNORMAL HIGH (ref 70–99)

## 2021-10-04 LAB — URINALYSIS, COMPLETE (UACMP) WITH MICROSCOPIC
Glucose, UA: 50 mg/dL — AB
Ketones, ur: NEGATIVE mg/dL
Nitrite: POSITIVE — AB
Protein, ur: 30 mg/dL — AB
Specific Gravity, Urine: 1.013 (ref 1.005–1.030)
pH: 5 (ref 5.0–8.0)

## 2021-10-04 LAB — POCT I-STAT 7, (LYTES, BLD GAS, ICA,H+H)
Acid-base deficit: 9 mmol/L — ABNORMAL HIGH (ref 0.0–2.0)
Bicarbonate: 15 mmol/L — ABNORMAL LOW (ref 20.0–28.0)
Calcium, Ion: 0.78 mmol/L — CL (ref 1.15–1.40)
HCT: 27 % — ABNORMAL LOW (ref 39.0–52.0)
Hemoglobin: 9.2 g/dL — ABNORMAL LOW (ref 13.0–17.0)
O2 Saturation: 94 %
Patient temperature: 98.1
Potassium: 4.2 mmol/L (ref 3.5–5.1)
Sodium: 135 mmol/L (ref 135–145)
TCO2: 16 mmol/L — ABNORMAL LOW (ref 22–32)
pCO2 arterial: 25.8 mmHg — ABNORMAL LOW (ref 32.0–48.0)
pH, Arterial: 7.37 (ref 7.350–7.450)
pO2, Arterial: 71 mmHg — ABNORMAL LOW (ref 83.0–108.0)

## 2021-10-04 LAB — BASIC METABOLIC PANEL
Anion gap: 18 — ABNORMAL HIGH (ref 5–15)
Anion gap: 17 — ABNORMAL HIGH (ref 5–15)
Anion gap: 17 — ABNORMAL HIGH (ref 5–15)
BUN: 71 mg/dL — ABNORMAL HIGH (ref 6–20)
BUN: 75 mg/dL — ABNORMAL HIGH (ref 6–20)
BUN: 77 mg/dL — ABNORMAL HIGH (ref 6–20)
CO2: 12 mmol/L — ABNORMAL LOW (ref 22–32)
CO2: 14 mmol/L — ABNORMAL LOW (ref 22–32)
CO2: 16 mmol/L — ABNORMAL LOW (ref 22–32)
Calcium: 6.1 mg/dL — CL (ref 8.9–10.3)
Calcium: 5.9 mg/dL — CL (ref 8.9–10.3)
Calcium: 6 mg/dL — CL (ref 8.9–10.3)
Chloride: 102 mmol/L (ref 98–111)
Chloride: 102 mmol/L (ref 98–111)
Chloride: 97 mmol/L — ABNORMAL LOW (ref 98–111)
Creatinine, Ser: 4.21 mg/dL — ABNORMAL HIGH (ref 0.61–1.24)
Creatinine, Ser: 4.52 mg/dL — ABNORMAL HIGH (ref 0.61–1.24)
Creatinine, Ser: 4.48 mg/dL — ABNORMAL HIGH (ref 0.61–1.24)
GFR, Estimated: 15 mL/min — ABNORMAL LOW (ref >60)
GFR, Estimated: 14 mL/min — ABNORMAL LOW (ref >60)
GFR, Estimated: 14 mL/min — ABNORMAL LOW (ref >60)
Glucose, Bld: 101 mg/dL — ABNORMAL HIGH (ref 70–99)
Glucose, Bld: 132 mg/dL — ABNORMAL HIGH (ref 70–99)
Glucose, Bld: 166 mg/dL — ABNORMAL HIGH (ref 70–99)
Potassium: 4.5 mmol/L (ref 3.5–5.1)
Potassium: 4.3 mmol/L (ref 3.5–5.1)
Potassium: 4.2 mmol/L (ref 3.5–5.1)
Sodium: 132 mmol/L — ABNORMAL LOW (ref 135–145)
Sodium: 133 mmol/L — ABNORMAL LOW (ref 135–145)
Sodium: 130 mmol/L — ABNORMAL LOW (ref 135–145)

## 2021-10-04 LAB — CBC
HCT: 31 % — ABNORMAL LOW (ref 39.0–52.0)
Hemoglobin: 10.3 g/dL — ABNORMAL LOW (ref 13.0–17.0)
MCH: 31.6 pg (ref 26.0–34.0)
MCHC: 33.2 g/dL (ref 30.0–36.0)
MCV: 95.1 fL (ref 80.0–100.0)
Platelets: 156 10*3/uL (ref 150–400)
RBC: 3.26 MIL/uL — ABNORMAL LOW (ref 4.22–5.81)
RDW: 15.9 % — ABNORMAL HIGH (ref 11.5–15.5)
WBC: 24.4 10*3/uL — ABNORMAL HIGH (ref 4.0–10.5)
nRBC: 0 % (ref 0.0–0.2)

## 2021-10-04 LAB — PHOSPHORUS: Phosphorus: 11.8 mg/dL — ABNORMAL HIGH (ref 2.5–4.6)

## 2021-10-04 LAB — MRSA NEXT GEN BY PCR, NASAL: MRSA by PCR Next Gen: NOT DETECTED

## 2021-10-04 LAB — CK
Total CK: 1585 U/L — ABNORMAL HIGH (ref 49–397)
Total CK: 1537 U/L — ABNORMAL HIGH (ref 49–397)
Total CK: 1581 U/L — ABNORMAL HIGH (ref 49–397)
Total CK: 1525 U/L — ABNORMAL HIGH (ref 49–397)

## 2021-10-04 LAB — MAGNESIUM: Magnesium: 2.7 mg/dL — ABNORMAL HIGH (ref 1.7–2.4)

## 2021-10-04 LAB — AMMONIA: Ammonia: 43 umol/L — ABNORMAL HIGH (ref 9–35)

## 2021-10-04 LAB — VANCOMYCIN, RANDOM: Vancomycin Rm: 11

## 2021-10-04 LAB — HIV ANTIBODY (ROUTINE TESTING W REFLEX): HIV Screen 4th Generation wRfx: NONREACTIVE

## 2021-10-04 LAB — LACTIC ACID, PLASMA: Lactic Acid, Venous: 1.3 mmol/L (ref 0.5–1.9)

## 2021-10-04 MED ORDER — ORAL CARE MOUTH RINSE
15.0000 mL | Freq: Two times a day (BID) | OROMUCOSAL | Status: DC
  Administered 2021-10-05: 15 mL via OROMUCOSAL

## 2021-10-04 MED ORDER — RIFAXIMIN 550 MG PO TABS
550.0000 mg | ORAL_TABLET | Freq: Two times a day (BID) | ORAL | Status: DC
  Administered 2021-10-04: 550 mg via ORAL
  Filled 2021-10-04 (×3): qty 1

## 2021-10-04 MED ORDER — ONDANSETRON 4 MG PO TBDP: 4.0000 mg | ORAL_TABLET | Freq: Four times a day (QID) | ORAL | Status: AC | PRN

## 2021-10-04 MED ORDER — LOPERAMIDE HCL 2 MG PO CAPS: 2.0000 mg | ORAL_CAPSULE | ORAL | Status: DC | PRN

## 2021-10-04 MED ORDER — CALCIUM GLUCONATE-NACL 1-0.675 GM/50ML-% IV SOLN
1.0000 g | Freq: Once | INTRAVENOUS | Status: AC
  Administered 2021-10-04: 1000 mg via INTRAVENOUS
  Filled 2021-10-04 (×2): qty 50

## 2021-10-04 MED ORDER — HYDROXYZINE HCL 25 MG PO TABS: 25.0000 mg | ORAL_TABLET | Freq: Four times a day (QID) | ORAL | Status: DC | PRN

## 2021-10-04 MED ORDER — CALCIUM GLUCONATE-NACL 1-0.675 GM/50ML-% IV SOLN
1.0000 g | Freq: Once | INTRAVENOUS | Status: AC
  Administered 2021-10-04: 1000 mg via INTRAVENOUS
  Filled 2021-10-04: qty 50

## 2021-10-04 MED ORDER — CHLORHEXIDINE GLUCONATE 0.12 % MT SOLN
15.0000 mL | Freq: Two times a day (BID) | OROMUCOSAL | Status: DC
  Filled 2021-10-04: qty 15

## 2021-10-04 MED ORDER — POLYETHYLENE GLYCOL 3350 17 G PO PACK: 17.0000 g | PACK | Freq: Every day | ORAL | Status: DC | PRN

## 2021-10-04 MED ORDER — SODIUM CHLORIDE 0.9 % IV SOLN
260.0000 mg | INTRAVENOUS | Status: DC | PRN
  Filled 2021-10-04: qty 2

## 2021-10-04 MED ORDER — PHENOBARBITAL SODIUM 130 MG/ML IJ SOLN
130.0000 mg | INTRAMUSCULAR | Status: DC | PRN
  Administered 2021-10-05: 130 mg via INTRAVENOUS
  Filled 2021-10-04: qty 1

## 2021-10-04 MED ORDER — INSULIN ASPART 100 UNIT/ML IJ SOLN
0.0000 [IU] | INTRAMUSCULAR | Status: DC
  Administered 2021-10-04: 2 [IU] via SUBCUTANEOUS
  Administered 2021-10-04: 3 [IU] via SUBCUTANEOUS
  Administered 2021-10-05: 2 [IU] via SUBCUTANEOUS
  Administered 2021-10-05: 5 [IU] via SUBCUTANEOUS
  Administered 2021-10-05 (×2): 2 [IU] via SUBCUTANEOUS

## 2021-10-04 MED ORDER — FOLIC ACID 1 MG PO TABS
1.0000 mg | ORAL_TABLET | Freq: Every day | ORAL | Status: DC
  Administered 2021-10-05: 1 mg via ORAL
  Filled 2021-10-04 (×2): qty 1

## 2021-10-04 MED ORDER — DOCUSATE SODIUM 100 MG PO CAPS: 100.0000 mg | ORAL_CAPSULE | Freq: Two times a day (BID) | ORAL | Status: DC | PRN

## 2021-10-04 MED ORDER — VANCOMYCIN HCL 1250 MG/250ML IV SOLN
1250.0000 mg | Freq: Once | INTRAVENOUS | Status: AC
  Administered 2021-10-04: 1250 mg via INTRAVENOUS
  Filled 2021-10-04: qty 250

## 2021-10-04 MED ORDER — ALBUMIN HUMAN 25 % IV SOLN
25.0000 g | Freq: Four times a day (QID) | INTRAVENOUS | Status: AC
  Administered 2021-10-04 – 2021-10-05 (×3): 25 g via INTRAVENOUS
  Filled 2021-10-04 (×3): qty 100

## 2021-10-04 MED ORDER — LACTATED RINGERS IV SOLN: INTRAVENOUS | Status: DC

## 2021-10-04 MED ORDER — CHLORDIAZEPOXIDE HCL 25 MG PO CAPS
50.0000 mg | ORAL_CAPSULE | Freq: Once | ORAL | Status: DC
  Filled 2021-10-04: qty 2

## 2021-10-04 MED ORDER — SODIUM CHLORIDE 0.9 % IV SOLN
2.0000 g | INTRAVENOUS | Status: DC
  Administered 2021-10-04: 2 g via INTRAVENOUS
  Filled 2021-10-04: qty 2

## 2021-10-04 MED ORDER — STERILE WATER FOR INJECTION IV SOLN
INTRAVENOUS | Status: DC
  Filled 2021-10-04 (×4): qty 1000

## 2021-10-04 MED ORDER — ZINC SULFATE 220 (50 ZN) MG PO CAPS
220.0000 mg | ORAL_CAPSULE | Freq: Every day | ORAL | Status: DC
  Administered 2021-10-05: 220 mg via ORAL
  Filled 2021-10-04 (×2): qty 1

## 2021-10-04 MED ORDER — CHLORDIAZEPOXIDE HCL 25 MG PO CAPS: 25.0000 mg | ORAL_CAPSULE | Freq: Four times a day (QID) | ORAL | Status: DC | PRN

## 2021-10-04 MED ORDER — CHLORDIAZEPOXIDE HCL 25 MG PO CAPS: 25.0000 mg | ORAL_CAPSULE | Freq: Three times a day (TID) | ORAL | Status: DC

## 2021-10-04 MED ORDER — ADULT MULTIVITAMIN W/MINERALS CH
1.0000 | ORAL_TABLET | Freq: Every day | ORAL | Status: DC
  Filled 2021-10-04 (×2): qty 1

## 2021-10-04 MED ORDER — LEVOTHYROXINE SODIUM 100 MCG PO TABS
200.0000 ug | ORAL_TABLET | Freq: Every day | ORAL | Status: DC
  Administered 2021-10-05: 200 ug via ORAL
  Filled 2021-10-04 (×2): qty 2

## 2021-10-04 MED ORDER — LACTULOSE 10 GM/15ML PO SOLN: 30.0000 g | Freq: Two times a day (BID) | ORAL | Status: DC | PRN

## 2021-10-04 MED ORDER — LACTATED RINGERS IV BOLUS
1000.0000 mL | Freq: Once | INTRAVENOUS | Status: AC
  Administered 2021-10-04: 1000 mL via INTRAVENOUS

## 2021-10-04 MED ORDER — ALBUMIN HUMAN 25 % IV SOLN
50.0000 g | Freq: Once | INTRAVENOUS | Status: AC
  Administered 2021-10-04: 50 g via INTRAVENOUS
  Filled 2021-10-04: qty 200

## 2021-10-04 MED ORDER — CHLORDIAZEPOXIDE HCL 25 MG PO CAPS: 25.0000 mg | ORAL_CAPSULE | Freq: Every day | ORAL | Status: DC

## 2021-10-04 MED ORDER — CALCIUM GLUCONATE-NACL 2-0.675 GM/100ML-% IV SOLN
2.0000 g | Freq: Once | INTRAVENOUS | Status: AC
  Administered 2021-10-05: 2000 mg via INTRAVENOUS
  Filled 2021-10-04: qty 100

## 2021-10-04 MED ORDER — CHLORDIAZEPOXIDE HCL 25 MG PO CAPS: 25.0000 mg | ORAL_CAPSULE | ORAL | Status: DC

## 2021-10-04 MED ORDER — VANCOMYCIN VARIABLE DOSE PER UNSTABLE RENAL FUNCTION (PHARMACIST DOSING): Status: DC

## 2021-10-04 MED ORDER — SODIUM CHLORIDE 0.9 % IV SOLN: 250.0000 mL | INTRAVENOUS | Status: DC

## 2021-10-04 MED ORDER — CHLORHEXIDINE GLUCONATE CLOTH 2 % EX PADS
6.0000 | MEDICATED_PAD | Freq: Every day | CUTANEOUS | Status: DC
  Administered 2021-10-04 – 2021-10-07 (×4): 6 via TOPICAL

## 2021-10-04 MED ORDER — POLYETHYLENE GLYCOL 3350 17 G PO PACK: 17.0000 g | PACK | Freq: Every day | ORAL | Status: DC

## 2021-10-04 MED ORDER — ALBUTEROL SULFATE (2.5 MG/3ML) 0.083% IN NEBU: 2.5000 mg | INHALATION_SOLUTION | Freq: Four times a day (QID) | RESPIRATORY_TRACT | Status: DC | PRN

## 2021-10-04 MED ORDER — SEVELAMER CARBONATE 800 MG PO TABS
800.0000 mg | ORAL_TABLET | Freq: Three times a day (TID) | ORAL | Status: DC
  Filled 2021-10-04: qty 1

## 2021-10-04 MED ORDER — MOMETASONE FURO-FORMOTEROL FUM 200-5 MCG/ACT IN AERO
2.0000 | INHALATION_SPRAY | Freq: Two times a day (BID) | RESPIRATORY_TRACT | Status: DC
  Administered 2021-10-04 – 2021-10-05 (×3): 2 via RESPIRATORY_TRACT
  Filled 2021-10-04: qty 8.8

## 2021-10-04 MED ORDER — CHLORDIAZEPOXIDE HCL 25 MG PO CAPS
25.0000 mg | ORAL_CAPSULE | Freq: Four times a day (QID) | ORAL | Status: DC
  Administered 2021-10-04: 25 mg via ORAL
  Filled 2021-10-04: qty 1

## 2021-10-04 MED ORDER — NOREPINEPHRINE 4 MG/250ML-% IV SOLN
2.0000 ug/min | INTRAVENOUS | Status: DC
  Administered 2021-10-04 – 2021-10-05 (×2): 2 ug/min via INTRAVENOUS
  Filled 2021-10-04 (×2): qty 250

## 2021-10-04 MED ORDER — PHENOBARBITAL SODIUM 130 MG/ML IJ SOLN
5.0000 mg/kg | Freq: Once | INTRAMUSCULAR | Status: AC
  Administered 2021-10-04: 487.5 mg via INTRAVENOUS
  Filled 2021-10-04: qty 3.75

## 2021-10-04 MED ORDER — THIAMINE HCL 100 MG PO TABS
100.0000 mg | ORAL_TABLET | Freq: Every day | ORAL | Status: DC
  Administered 2021-10-05: 100 mg via ORAL
  Filled 2021-10-04 (×2): qty 1

## 2021-10-04 NOTE — Progress Notes (Signed)
[facility] Progress Note Patient Name: Nykeem Citro. DOB: 23-Jan-1961 MRN: [id number]   Date of Service  10/04/2021  HPI/Events of Note  CK 1525 (trending down), Ca++  0.78  eICU Interventions  Calcium gluconate 2 gm  iv x 1 ordered.        Kerry Kass Yukie Bergeron 10/04/2021, 11:10 PM

## 2021-10-04 NOTE — Progress Notes (Signed)
Pharmacy Antibiotic Note  Colin Allen. is a 60 y.o. male admitted on 10/04/2021 with sepsis.  Pharmacy has been consulted for Vancomycin/Cefepime dosing.  WBC is elevated  Renal function worsening (3.77>>4.15)  Plan: Vancomycin 1000 mg IV x 1 given at [facility] ED >>Check random vancomycin level at 24 hours  Cefepime 2g IV q24h Trend WBC, temp, renal function  F/U infectious work-up Drug levels as indicated   Weight: 97.5 kg (214 lb 15.2 oz)  Temp (24hrs), Avg:98.1 F (36.7 C), Min:97.4 F (36.3 C), Max:98.5 F (36.9 C)  Recent Labs  Lab 10/03/21 1728 10/03/21 1813 10/04/21 0332  WBC 28.5*  --  24.4*  CREATININE 3.77*  --  4.15*  LATICACIDVEN  --  1.8  --     Estimated Creatinine Clearance: 23.2 mL/min (A) (by C-G formula based on SCr of 4.15 mg/dL (H)).    Allergies  Allergen Reactions   Lisinopril Swelling    Other reaction(s): Angioedema    Bee Venom Swelling   Acetaminophen Other (See Comments)    Liver disease Liver disease    Narda Bonds, PharmD, BCPS Clinical Pharmacist Phone: [phone]

## 2021-10-04 NOTE — Consult Note (Addendum)
[name] Renal Consultation Note  Requesting MD: Dr. Earlie Server Indication for Consultation:  AKI   Chief complaint: Altered Mental Status  HPI:  Colin Allen. is a 60 y.o. male with PMHx of alcohol abuse, cirrhosis, HTN, HLD, DM, and emphysema who presented to [facility] Emergency Department on 11/8 for AMS after being found down post-fall after family had not heard from him in several days.   On initial work up in the ED he was found to have hypotension (67/45) and elevated WBC 28.5 and met sepsis criteria. He received fluid resuscitation and started on cefepime, vancomycin and flagyl. His creatinine was 3.77 (baseline 1.2) and he had elevated LFTs at 561 and 177 for AST and ALT respectively. CK was elevated at 1913. CT imaging showed an irregular 3.3 cm pancreatic tail mass concerning for neoplasm with "innumerable" metastasis measuring up to 4.4 cm.   He was transferred to [facility] and admitted to the ICU today.   Patient states that he fell on Saturday. He states that he was able to get right back up.  He thinks that he came to the hospital by ambulance.  He is not quite sure why he is here but states he has gotten really good sleep here. He states that his last drink was last Friday, he had about 1 pint of vodka.  He denies drinking every day but states he drinks maybe every week or so. He denies any pain, nausea, vomiting, abdominal pain, edema, SOB, chest pain. Has chronic cough from COPD but denies hemoptysis. States he takes Ibuprofen intermittently for headaches but has not taken it in a while.  States that he followed with a nephrologist in the past, believes it was with [facility] but unsure of which doctor.  States that he was only told to come back as needed. He denies previous withdrawal from alcohol.   Creatinine  Date/Time Value Ref Range Status  07/12/2014 10:12 AM 0.96 0.60 - 1.30 mg/dL Final   Creatinine, Ser  Date/Time Value Ref Range Status   10/04/2021 09:17 AM 4.21 (H) 0.61 - 1.24 mg/dL Final  10/04/2021 03:32 AM 4.15 (H) 0.61 - 1.24 mg/dL Final  10/03/2021 05:28 PM UNABLE TO REPORT DUE TO ICTERUS 0.61 - 1.24 mg/dL Corrected    Comment:    CORRECTED RESULTS CORRECTED ON 11/09 AT 1140: PREVIOUSLY REPORTED AS 3.77    PMHx:   Past Medical History:  Diagnosis Date   Back pain    Cirrhosis of liver ([place]) 2010   Colon polyps 02/2014   found 3 polyps   Diabetes mellitus without complication (HCC)    Emphysema of lung ([place])    Fractured elbow 1993   right elbow   Hyperlipidemia    Hypertension    Sleep apnea    Thyroid disease     Past Surgical History:  Procedure Laterality Date   COLONOSCOPY  2015   cyst on wrist  2015   LEG SURGERY Left 1968   rod was placed to correct his foot    Family Hx:  Family History  Problem Relation Age of Onset   Cancer Father        lung   Cancer Maternal Grandmother        unknown   Cancer Paternal Grandmother        unknown   Cancer Paternal Grandfather        unsure if it was colon or prostate    Social History:  reports that he  has quit smoking. His smoking use included cigarettes. He has a 35.00 pack-year smoking history. He does not have any smokeless tobacco history on file. He reports that he does not drink alcohol and does not use drugs.  Allergies:  Allergies  Allergen Reactions   Lisinopril Swelling    Other reaction(s): Angioedema    Bee Venom Swelling   Acetaminophen Other (See Comments)    Liver disease Liver disease     Medications: Prior to Admission medications   Medication Sig Start Date End Date Taking? Authorizing Provider  albuterol (VENTOLIN HFA) 108 (90 Base) MCG/ACT inhaler Inhale 2 puffs into the lungs every 6 (six) hours as needed. 11/30/14   [provider]  amLODipine (NORVASC) 10 MG tablet Take 10 mg by mouth daily. 06/19/21   [provider]  atorvastatin (LIPITOR) 40 MG tablet Take 40 mg by mouth at bedtime. 08/07/21    [provider]  budesonide-formoterol (SYMBICORT) 160-4.5 MCG/ACT inhaler Inhale 1 puff into the lungs 2 (two) times daily.    [provider]  fenofibrate (TRICOR) 145 MG tablet Take 145 mg by mouth at bedtime. 08/07/21   [provider]  folic acid (FOLVITE) 1 MG tablet Take 1 mg by mouth daily. 08/07/21   [provider]  gemfibrozil (LOPID) 600 MG tablet Take 600 mg by mouth 2 (two) times daily before a meal. Patient not taking: No sig reported    [provider]  HUMALOG MIX 75/25 KWIKPEN (75-25) 100 UNIT/ML KwikPen Inject 40 Units into the skin 2 (two) times daily. 08/08/21   [provider]  Insulin Aspart Prot & Aspart (NOVOLOG MIX 70/30 [place]) Inject 35 Units into the skin daily. Patient not taking: No sig reported    [provider]  LANTUS SOLOSTAR 100 UNIT/ML Solostar Pen Inject 60 Units into the skin at bedtime. 06/20/21   [provider]  levothyroxine (SYNTHROID, LEVOTHROID) 200 MCG tablet Take 200 mcg by mouth daily before breakfast.    [provider]  linaclotide (LINZESS) 290 MCG CAPS capsule Take 290 mcg by mouth daily. Patient not taking: No sig reported    [provider]  lisinopril (PRINIVIL,ZESTRIL) 10 MG tablet Take 10 mg by mouth daily. Patient not taking: No sig reported    [provider]  metoprolol succinate (TOPROL-XL) 25 MG 24 hr tablet Take 25 mg by mouth every morning. 08/07/21   [provider]  oxyCODONE-acetaminophen (PERCOCET) 7.5-325 MG per tablet Take 1 tablet by mouth every 6 (six) hours as needed for pain. Patient not taking: No sig reported    [provider]  terazosin (HYTRIN) 5 MG capsule Take 5 mg by mouth daily. 08/07/21   [provider]  Vitamin D, Ergocalciferol, (DRISDOL) 1.25 MG (50000 UNIT) CAPS capsule Take 50,000 Units by mouth every 30 (thirty) days. 06/22/21   [provider]  zolpidem (AMBIEN) 10 MG tablet Take 10  mg by mouth at bedtime as needed for sleep.    [provider]    I have reviewed the patient's current medications.  Labs:  BMP Latest Ref Rng & Units 10/04/2021 10/04/2021 10/04/2021  Glucose 70 - 99 mg/dL - 101(H) 138(H)  BUN 6 - 20 mg/dL - 71(H) 66(H)  Creatinine 0.61 - 1.24 mg/dL - 4.21(H) 4.15(H)  Sodium 135 - 145 mmol/L 135 132(L) 132(L)  Potassium 3.5 - 5.1 mmol/L 4.2 4.5 4.4  Chloride 98 - 111 mmol/L - 102 101  CO2 22 - 32 mmol/L - 12(L)  15(L)  Calcium 8.9 - 10.3 mg/dL - 6.1(LL) 6.4(LL)    Urinalysis    Component Value Date/Time   COLORURINE AMBER (A) 10/04/2021 0500   APPEARANCEUR CLOUDY (A) 10/04/2021 0500   APPEARANCEUR Clear 07/12/2014 1012   LABSPEC 1.013 10/04/2021 0500   LABSPEC 1.018 07/12/2014 1012   PHURINE 5.0 10/04/2021 0500   GLUCOSEU 50 (A) 10/04/2021 0500   GLUCOSEU >=500 07/12/2014 1012   HGBUR MODERATE (A) 10/04/2021 0500   BILIRUBINUR MODERATE (A) 10/04/2021 0500   BILIRUBINUR Negative 07/12/2014 1012   KETONESUR NEGATIVE 10/04/2021 0500   PROTEINUR 30 (A) 10/04/2021 0500   NITRITE POSITIVE (A) 10/04/2021 0500   LEUKOCYTESUR SMALL (A) 10/04/2021 0500   LEUKOCYTESUR Negative 07/12/2014 1012     ROS:  Pertinent items are noted in HPI.  Physical Exam: Vitals:   10/04/21 1200 10/04/21 1300  BP: (!) 86/49 92/63  Pulse: 80 72  Resp: (!) 32 (!) 33  Temp:    SpO2: 99% 98%     General: Chronically ill appearing, jaundiced, edentulous, speech is difficult to comprehend HEENT: Normocephalic, jaundiced, dry MM Eyes: scleral icterus  Neck: enlarged Heart: RRR without murmur Lungs: CTAB anterior fields Abdomen: distended, without obvious fluid wave, non-tender in all quadrants, no rebound/guarding Extremities: SCDs in place lower extremities, small abrasion to right patella Skin: Jaundiced Neuro: awake, alert to person, place but not time. Speech is difficult to comprehend  Assessment/Plan: Colin Ray. is a 60 y.o. male who  has a PMHx of EtOH abuse with cirrhosis, well controlled T2DM, HTN, HLD, who presented to the ED for AMS, now admitted for acute hepatic encephalopathy and concern for septic shock. 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 Nephrology has been consulted for management of AKI/rhabdomyolysis.   AKI likely 2/2 septic shock and ATN; may have component of rhabdo  -Creatinine 4.21. Unclear baseline but 1.21 with GFR 66 in 01/2020. States he has followed with [facility] in the past.  -CK elevated and down-trending: [phone] -Electrolytes are notable for hyponatremia 133 (corrects to 134), BUN 63, Calcium 7.8 -Renal U/S with trace amount of left perinephric fluid collection -UA with moderate Hgb, moderate bilirubin, positive nitrite, small leukocytes and many bacteria  -Continue IVF hydration; will likely need CRRT   Anion Gap Metabolic Acidosis  -Anion gap 17 -Continue sodium bicarb 150 mEq at 125 mL/hr  Hyperphosphatemia -Phos 11.8; likely in the setting of AKI, rhabdomyolysis   Normocytic Anemia -Hgb 10.3, MCV 95  Septic Shock  -WBC 24.4 -On Levophed, cefepime  -Management per primary   Decompensated EtOH cirrhosis  Elevated LFTs Hyperbilirubinemia, Hyperammonemia   -Management per primary  -Lipase WNL, Ammonia 43 -AST 561, ALT 177, Alk Phos 501, total bili 54.6 -On folic acid, MVI, thiamine, rifaximin, albumin,   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 No c-spine fracture  -Per primary; CIWA  COPD  OSA -Management per primary  HTN  -Management per primary  HLD -Management per  primary  T2DM -Hgb A1c 5.8 -Management per primary    Alejandra Espinoza 10/04/2021, 1:48 PM    Seen and examined independently.  Agree with note and exam as documented above by Dr. Nita Sells  and as noted here.  I spoke with his sister - they are close.  She states that the patient normally checks in with his mother daily and when his mother had not heard from him for 3 days they located him at home down.  he was hypotensive on arrival to outside hospital and has been started on Levophed at 2 mcg/min.  The patient's sister states that states that the patient does not have much of a relationship with his adult children and that they do not know that he is currently ill.  The patient's mother was recently injured after a fall and is on pain medication and in her 87s.  His sister is making his medical decisions.  She would want him to have dialysis and access placement if it were needed in order to help him.  We discussed the risks and benefits and indications.  She has some familiarity with dialysis as her other brother also has cirrhosis and alcohol abuse and had required dialysis in the past.    General adult male in bed in no acute distress. HEENT normocephalic atraumatic extraocular movements intact; iteric sclera  Neck supple trachea midline Lungs clear to auscultation bilaterally normal work of breathing at rest on room air Heart S1S2 no rub Abdomen soft nontender obese body habitus Extremities no pitting edema  Psych - circumferential  Neuro - oriented to person not to date or situation Skin jaundice GU no foley   # AKI -Secondary to ATN.  He is normally on multiple antihypertensives and was hypotensive when found by EMS.  One agent is an ACE inhibitor -Agree with fluids - His sister would want dialysis if needed we discussed that I anticipate that this CRRT will be needed within the next 12 to 24 hours - I contacted critical care and they are assisting with line placement.  I  appreciate their assistance  # Metabolic acidosis - Starting bicarbonate infusion - Anticipate need for CRRT soon   # Septic shock -On Levophed at 2 mics per minute - Antibiotics per primary team  # Encephalopathy - metabolic  - multifactorial with AKI and cirrhosis  - if no improvement in encephalopathy with correction of acidosis would consider transition to an alternative to cefepime   # Pancreatic mass -I have not discussed this with the patient given AMS.  The patient sister's offers the comment that she was told that the liver looked like he had evidence of cancer  # Cirrhosis - decompensated  - longstanding EtOH per his sister - on lactulose per critical care team   # Alcohol abuse  -Team is monitoring for withdrawal  # Hyperphosphatemia - Start binder short term with meals and then will need to stop when transition to CRRT - renal diet  # Hypocalcemia - repleting x 2 grams now  Thank you for the consult.  Please do not hesitate to contact me with any questions regarding our patient  Claudia Desanctis, MD 10/04/2021 4:26 PM

## 2021-10-04 NOTE — Progress Notes (Signed)
Patient is uncooperative  Needs a dialysis catheter for CRRT  I did speak with the sister over the phone who is coming in physically to assist with further discussions with the patient-she stated she will be hearing about 30 minutes  He is refusing to have any intervention performed at present  He may end up needing to sedate and probably need ventilatory support to get him to allow [place] to intervene  Will follow

## 2021-10-04 NOTE — H&P (Signed)
NAME:  Colin Allen, Colin Allen MRN:  [id number], DOB:  1961-05-17, LOS: 0 ADMISSION DATE:  10/04/2021 CONSULTATION DATE:  10/04/2021 REFERRING MD:  Cinda Quest - [facility] CHIEF COMPLAINT:  AMS, Sepsis  History of Present Illness:  60 year old man who presented to [facility] via EMS 11/9 for AMS, weakness, possible sepsis. Found down post-fall after family had not heard from him x 3 days. Jaundiced and hypotensive on EMS arrival. 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  On ED arrival, vitals notable for HR 77, BP 67/45, afebrile. Labs were notable for WBC 28.5, stable H&H, normal Plt (190). INR elevated 1.5. Na 133, K 4.9, CO2 19, Cr 3.77 (baseline 1.2 2021). Transaminases elevated to 561/177, Tbili 29.6. Ammonia 51, LA 1.8. Trop 74 (69) and BNP 88.7. CK 1913 with concern for rhabdomyolysis. CT Head NAICA. CT A/P notable for irregular 3.3cm pancreatic tail mass c/f neoplasm as well as "innumerable" metastases measuring up to 4.4cm.  Patient was transferred to [facility] 11/9 for further evaluation and management.  On arrival to [facility], patient assessed at bedside. States that he initially fell Friday, 11/4 while carrying groceries but was able to get up. He states that he fell again Sunday, 11/6 but is unclear on how this fall occurred and does not seem to remember being unable to get up; reports that he came to the hospital on his own without EMS assistance. Did not feel dizzy or lightheaded prior to falls and states these were mechanical. Denies fevers or recent illness at home. Occasional SOB for which he utilizes 2L O2 and/or rescue inhaler, daily Symbicort. Denies nausea/vomiting, changes in bowel habits/blood in stool, LE or abdominal swelling. Endorses jaundice at baseline. Patient states that he sees his PCP every 3 months. Has lost around 70lb in a year. Tries to remain on top of his diet (previously with high cholesterol). Reports checking his blood  glucoses TID with meals as well as frequent BPs, but he cannot say what his normal BPs are.  Pertinent Medical History:   Past Medical History:  Diagnosis Date   Back pain    Cirrhosis of liver ([place]) 2010   Colon polyps 02/2014   found 3 polyps   Diabetes mellitus without complication ([place])    Emphysema of lung ([place])    Fractured elbow 1993   right elbow   Hyperlipidemia    Hypertension    Sleep apnea    Thyroid disease    Significant Hospital Events: Including procedures, antibiotic start and stop dates in addition to other pertinent events   11/8 - Presented to [facility] via EMS for AMS after being found down, unknown length of time. C/f rhabdo with elevated CK/AKI. Also with transaminitis + hyperbilirubinemia. CT A/P with new pancreatic tail mass + mets. 11/9 - Transferred to [facility] for further management.  Interim History / Subjective:  Arrived from [facility] Remains hypotensive with SBPs 90s Multiple significant lab abnormalities including LFTs, CK/Cr CT A/P with new pancreatic tail mass + ?mets  Objective:  Blood pressure 90/61, pulse 80, temperature (!) 97.4 F (36.3 C), temperature source Oral, resp. rate (!) 27, weight 97.5 kg, SpO2 98 %.       No intake or output data in the 24 hours ending 10/04/21 0202 Filed Weights   10/04/21 0150  Weight: 97.5 kg   Physical Examination: General: Acute-on-chronically ill-appearing middle-aged man in NAD. Extremely jaundiced. HEENT: [place]/AT, +scleral icterus, PERRL, dry mucous membranes. Neuro: Awake, oriented  x 4. Intermittent confusion likely attributed to hepatic encephalopathy/sepsis.Responds to verbal stimuli. Following commands consistently. Moves all 4 extremities spontaneously. Strength 4-5/5 in all 4 extremities. +Asterixis CV: RRR, no m/g/r. PULM: Breathing even and unlabored on RA. Lung fields CTAB anteriorly, slightly diminished at bilateral bases. GI: Soft, nontender, mildly distended. Hypoactive bowel sounds. Extremities: No  LE edema noted. Skin: Warm/dry, scattered areas of ecchymosis over L forearm, R anterior shin/ankle. Significant jaundice.  Resolved Hospital Problem List:    Assessment & Plan:  AMS, s/p fall at home with prolonged downtime Acute hepatic encephalopathy, likely with component of critical illness/sepsis Presented to [facility] via EMS after being found down at home; family had not heard from him in ~3 days. CT Head NAICA. Oriented on assessment with periodic/intermittent confusion especially with regard to event recall. Intermittently telling myself/RN that he plans to go home this evening (11/8). I am concerned that he has very poor insight with regard to his illness and current clinical state. - Admit to ICU for close monitoring - Sepsis/rhabdo workups as below - Treat reversible components of encephalopathy - Low threshold for repeat head imaging if any significant mental status changes  Concern for septic shock Concern for sepsis in the setting of leukocytosis (WBC 28.5), hypotension and AKI. No focal source yet identified. - Goal MAP > 65 - May require pressor initiation if unable to maintain MAP goal - Trend CBC/fever curve - Trend LA - F/u Cx data - Empiric antibiotic coverage with cefepime, vanc  Severe AKI with likely rhabdomyolysis Found down after ~3 days post-fall. CK 1913 on admission. Cr 3.77 from last known baseline 1.2. McMahon score 6.5. - Aggressive fluid resuscitation in the setting of rhabdo - Trend BMP - Replete electrolytes as indicated - Monitor I&Os - F/u urine studies - Avoid nephrotoxic agents as able - Ensure adequate renal perfusion  Decompensated EtOH cirrhosis Transaminitis Hyperbilirubinemia Longstanding history of cirrhosis (diagnosied ~2010). Appears to be managed primarily by PCP. Transaminases and Tbili significantly elevated. 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 - Oncology consult as able  COPD OSA - Continue home Symbicort, albuterol PRN - DuoNebs while in-house - CPAP QHS, if he will tolerate it  T2DM - CBGs Q4H - SSI  Hypertension Hyperlipidemia - Hold home antihypertensives in the setting of likely sepsis - Resume home Lipitor, Lopid, Tricor as clinically appropriate  Best Practice: (right click and "Reselect all SmartList Selections" daily)   Diet/type: Regular consistency (see orders) - 2G Na DVT prophylaxis: other - held in the setting of elevated INR GI prophylaxis: PPI Lines: N/A Foley:  N/A Code Status:  full code Last date of multidisciplinary goals of care discussion [Pending]  Labs:  CBC: Recent Labs  Lab 10/03/21 1728  WBC 28.5*  NEUTROABS 25.5*  HGB 11.3*  HCT 33.5*  MCV 95.2  PLT 681   Basic Metabolic Panel: Recent Labs  Lab 10/03/21 1728  NA 133*  K 4.9  CL 97*  CO2 19*  GLUCOSE 157*  BUN 63*  CREATININE 3.77*  CALCIUM 7.8*   GFR: Estimated Creatinine Clearance: 25.5 mL/min (A) (by C-G formula based on SCr of 3.77 mg/dL (H)). Recent Labs  Lab 10/03/21 1728 10/03/21 1813  WBC 28.5*  --   LATICACIDVEN  --  1.8   Liver Function Tests: Recent Labs  Lab 10/03/21 1728  AST 561*  ALT 177*  ALKPHOS 501*  BILITOT 29.6*  PROT 6.9  ALBUMIN 1.8*   Recent Labs  Lab 10/03/21 1728  LIPASE 35   Recent Labs  Lab 10/03/21 1813  AMMONIA 51*   ABG: No results found for: PHART, PCO2ART, PO2ART, HCO3, TCO2, ACIDBASEDEF, O2SAT   Coagulation Profile: Recent Labs  Lab 10/03/21 1728  INR 1.5*   Cardiac Enzymes: Recent Labs  Lab 10/03/21 1728  CKTOTAL 1,913*   HbA1C: No results found for: HGBA1C  CBG: Recent Labs  Lab 10/04/21 0148  GLUCAP 142*   Review of Systems:   Review of systems completed with pertinent  positives/negatives outlined in above HPI.  Past Medical History:  He,  has a past medical history of Back pain, Cirrhosis of liver ([place]) (2010), Colon polyps (02/2014), Diabetes mellitus without complication ([place]), Emphysema of lung ([place]), Fractured elbow (1993), Hyperlipidemia, Hypertension, Sleep apnea, and Thyroid disease.   Surgical History:   Past Surgical History:  Procedure Laterality Date   COLONOSCOPY  2015   cyst on wrist  2015   LEG SURGERY Left 1968   rod was placed to correct his foot    Social History:   reports that he has quit smoking. His smoking use included cigarettes. He has a 35.00 pack-year smoking history. He does not have any smokeless tobacco history on file. He reports that he does not drink alcohol and does not use drugs.   Family History:  His family history includes Cancer in his father, maternal grandmother, paternal grandfather, and paternal grandmother.   Allergies: Allergies  Allergen Reactions   Lisinopril Swelling    Other reaction(s): Angioedema    Bee Venom Swelling   Acetaminophen Other (See Comments)    Liver disease Liver disease     Home Medications: Prior to Admission medications   Medication Sig Start Date End Date Taking? Authorizing Provider  albuterol (VENTOLIN HFA) 108 (90 Base) MCG/ACT inhaler Inhale 2 puffs into the lungs every 6 (six) hours as needed. 11/30/14   [provider]  amLODipine (NORVASC) 10 MG tablet Take 10 mg by mouth daily. 06/19/21   [provider]  atorvastatin (LIPITOR) 40 MG tablet Take 40 mg by mouth at bedtime. 08/07/21   [provider]  budesonide-formoterol (SYMBICORT) 160-4.5 MCG/ACT inhaler Inhale 1 puff into the lungs 2 (two) times daily.    [provider]  fenofibrate (TRICOR) 145 MG tablet Take 145 mg by mouth at bedtime. 08/07/21   [provider]  folic acid (FOLVITE) 1 MG tablet Take 1 mg by mouth daily. 08/07/21   [provider]  gemfibrozil  (LOPID) 600 MG tablet Take 600 mg by mouth 2 (two) times daily before a meal. Patient not taking: No sig reported    [provider]  HUMALOG MIX 75/25 KWIKPEN (75-25) 100 UNIT/ML KwikPen Inject 40 Units into the skin 2 (two) times daily. 08/08/21   [provider]  Insulin Aspart Prot & Aspart (NOVOLOG MIX 70/30 [place]) Inject 35 Units into the skin daily. Patient not taking: No sig reported    [provider]  LANTUS SOLOSTAR 100 UNIT/ML Solostar Pen Inject 60 Units into the skin at bedtime. 06/20/21   [provider]  levothyroxine (SYNTHROID, LEVOTHROID) 200 MCG tablet Take 200 mcg by mouth daily before breakfast.    [provider]  linaclotide (LINZESS) 290 MCG CAPS capsule Take 290 mcg by mouth  daily. Patient not taking: No sig reported    [provider]  lisinopril (PRINIVIL,ZESTRIL) 10 MG tablet Take 10 mg by mouth daily. Patient not taking: No sig reported    [provider]  metoprolol succinate (TOPROL-XL) 25 MG 24 hr tablet Take 25 mg by mouth every morning. 08/07/21   [provider]  oxyCODONE-acetaminophen (PERCOCET) 7.5-325 MG per tablet Take 1 tablet by mouth every 6 (six) hours as needed for pain. Patient not taking: No sig reported    [provider]  terazosin (HYTRIN) 5 MG capsule Take 5 mg by mouth daily. 08/07/21   [provider]  Vitamin D, Ergocalciferol, (DRISDOL) 1.25 MG (50000 UNIT) CAPS capsule Take 50,000 Units by mouth every 30 (thirty) days. 06/22/21   [provider]  zolpidem (AMBIEN) 10 MG tablet Take 10 mg by mouth at bedtime as needed for sleep.    [provider]    Critical care time: 50 minutes   Lestine Mount, PA-C [facility] 10/04/21 2:02 AM  Please see [email or web address] for pager details.  From 7A-7P if no response, please call [phone] After hours, please call ELink [phone]

## 2021-10-04 NOTE — Progress Notes (Addendum)
[place] Progress Note Patient Name: Colin Allen. DOB: March 21, 1961 MRN: [id number]   Date of Service  10/04/2021  HPI/Events of Note  Albumin < 1.5 gm / dl, Creatinine 4.15, corrected calcium 8.4 , Phosphorus 11.8, Bilirubin 24.9.  eICU Interventions  Albumin 25 % 50 gm iv x 1, will check ammonia level, patient will need GI and Nephrology consults.        Kerry Kass Amrie Gurganus 10/04/2021, 5:50 AM

## 2021-10-04 NOTE — Progress Notes (Signed)
Patient refusing any nursing care throughout the day including, medication administration, mobility, feedings, and insertion of indwelling foley catheter for accurate I&O. When discussing plan of care patient becomes increasing agitated and request to be seen by his Primary Care Physician only. Alert & Oriented x3.

## 2021-10-04 NOTE — Progress Notes (Addendum)
Pharmacy Antibiotic Note  Colin Allen. is a 60 y.o. male admitted on 10/04/2021 with sepsis and AKI.  Pharmacy was consulted for vancomycin/cefepime dosing.  Pt rec'd vancomycin 1 gm IV X 1 yesterday (11/8) at ~2100. Due to pt's unstable/worsening renal function, vancomycin is being dosed by vancomycin levels; vancomycin random level ~23 hrs after initial vancomycin dose was 11 mg/L. Renal function worsening; Scr 4.15 > 4.21 >4.52 > 4.48 this evening; planning to transition pt to CRRT tomorrow  WBC 24.4 (down from 28.5 yesterday); afebrile; CrCl 21.5 ml/min  Plan: Vancomycin 1250 mg IV X 1 Due to unstable/poor renal function, will continue to dose vancomycin by levels at this point; check vancomycin random level in 24 hrs (or earlier, if renal function improves); ck Scr at same time as vancomycin level to evaluate renal function trend Continue cefepime 2 gm IV Q 24 hrs Monitor WBC, temp, clinical course, cultures, renal function, vancomycin levels  Weight: 97.5 kg (214 lb 15.2 oz)  Temp (24hrs), Avg:98 F (36.7 C), Min:97.4 F (36.3 C), Max:98.5 F (36.9 C)  Recent Labs  Lab 10/03/21 1728 10/03/21 1813 10/04/21 0332 10/04/21 0917 10/04/21 1535 10/04/21 2012  WBC 28.5*  --  24.4*  --   --   --   CREATININE UNABLE TO REPORT DUE TO ICTERUS  --  4.15* 4.21* 4.52*  --   LATICACIDVEN  --  1.8  --  1.3  --   --   VANCORANDOM  --   --   --   --   --  11     Estimated Creatinine Clearance: 21.3 mL/min (A) (by C-G formula based on SCr of 4.52 mg/dL (H)).    Allergies  Allergen Reactions   Lisinopril Swelling    Other reaction(s): Angioedema    Bee Venom Swelling   Acetaminophen Other (See Comments)    Liver disease Liver disease    Antimicrobials This Admission: Vancomycin 11/8 >> Cefepime 11/8 >>  Microbiology Data: 22/8 COVID, flu A, flu B: negative 11/9 HIV screen: negative 11/9 Bld cx X 2: pending 11/9 Urine cx: pending  Gillermina Hu, PharmD, BCPS,  [facility] Clinical Pharmacist

## 2021-10-04 NOTE — Progress Notes (Signed)
Patient currently has 2 IV's at this time. Nurse declines 3rd IV placement. Instructed to consult IV team for further needs. Fran Lowes, RN VAST

## 2021-10-04 NOTE — Progress Notes (Signed)
Date and time results received: 10/04/21 2225 (use smartphrase ".now" to insert current time)  Test: Calcium Critical Value: 6.0  Name of Provider Notified: Elink  Orders Received? Or Actions Taken?:  Consistent with previous low value- Awaiting new orders. Milford Cage, RN

## 2021-10-04 NOTE — ED Notes (Signed)
Tara from [facility] called states pt has bed Colin Allen [phone].  Call report to [phone].

## 2021-10-04 NOTE — Progress Notes (Signed)
[facility] Progress Note Patient Name: Colin Allen. 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 for further work up and Rx.  eICU Interventions  New Patient Evaluation.        Kerry Kass Shanessa Hodak 10/04/2021, 2:15 AM

## 2021-10-05 ENCOUNTER — Inpatient Hospital Stay (HOSPITAL_COMMUNITY): Payer: Medicare Other

## 2021-10-05 DIAGNOSIS — G9341 Metabolic encephalopathy: Secondary | ICD-10-CM

## 2021-10-05 DIAGNOSIS — Z711 Person with feared health complaint in whom no diagnosis is made: Secondary | ICD-10-CM

## 2021-10-05 DIAGNOSIS — R0602 Shortness of breath: Secondary | ICD-10-CM | POA: Diagnosis not present

## 2021-10-05 DIAGNOSIS — Z7189 Other specified counseling: Secondary | ICD-10-CM

## 2021-10-05 DIAGNOSIS — N179 Acute kidney failure, unspecified: Secondary | ICD-10-CM | POA: Diagnosis not present

## 2021-10-05 DIAGNOSIS — Z515 Encounter for palliative care: Secondary | ICD-10-CM

## 2021-10-05 DIAGNOSIS — R4182 Altered mental status, unspecified: Secondary | ICD-10-CM | POA: Diagnosis not present

## 2021-10-05 DIAGNOSIS — Z66 Do not resuscitate: Secondary | ICD-10-CM

## 2021-10-05 DIAGNOSIS — Z789 Other specified health status: Secondary | ICD-10-CM

## 2021-10-05 LAB — BASIC METABOLIC PANEL
Anion gap: 17 — ABNORMAL HIGH (ref 5–15)
BUN: 80 mg/dL — ABNORMAL HIGH (ref 6–20)
CO2: 18 mmol/L — ABNORMAL LOW (ref 22–32)
Calcium: 6.1 mg/dL — CL (ref 8.9–10.3)
Chloride: 96 mmol/L — ABNORMAL LOW (ref 98–111)
Creatinine, Ser: 4.75 mg/dL — ABNORMAL HIGH (ref 0.61–1.24)
GFR, Estimated: 13 mL/min — ABNORMAL LOW (ref >60)
Glucose, Bld: 112 mg/dL — ABNORMAL HIGH (ref 70–99)
Potassium: 3.6 mmol/L (ref 3.5–5.1)
Sodium: 131 mmol/L — ABNORMAL LOW (ref 135–145)

## 2021-10-05 LAB — URINE CULTURE: Culture: <10000 — AB

## 2021-10-05 LAB — GLUCOSE, CAPILLARY
Glucose-Capillary: 129 mg/dL — ABNORMAL HIGH (ref 70–99)
Glucose-Capillary: 119 mg/dL — ABNORMAL HIGH (ref 70–99)
Glucose-Capillary: 147 mg/dL — ABNORMAL HIGH (ref 70–99)
Glucose-Capillary: 139 mg/dL — ABNORMAL HIGH (ref 70–99)

## 2021-10-05 LAB — CBC
HCT: 26.8 % — ABNORMAL LOW (ref 39.0–52.0)
Hemoglobin: 9 g/dL — ABNORMAL LOW (ref 13.0–17.0)
MCH: 30.7 pg (ref 26.0–34.0)
MCHC: 33.6 g/dL (ref 30.0–36.0)
MCV: 91.5 fL (ref 80.0–100.0)
Platelets: 144 10*3/uL — ABNORMAL LOW (ref 150–400)
RBC: 2.93 MIL/uL — ABNORMAL LOW (ref 4.22–5.81)
RDW: 15.6 % — ABNORMAL HIGH (ref 11.5–15.5)
WBC: 18.8 10*3/uL — ABNORMAL HIGH (ref 4.0–10.5)
nRBC: 0 % (ref 0.0–0.2)

## 2021-10-05 LAB — CREATININE, SERUM
Creatinine, Ser: 5.41 mg/dL — ABNORMAL HIGH (ref 0.61–1.24)
GFR, Estimated: 11 mL/min — ABNORMAL LOW (ref >60)

## 2021-10-05 LAB — VANCOMYCIN, RANDOM: Vancomycin Rm: 24

## 2021-10-05 LAB — CALCIUM, IONIZED: Calcium, Ionized, Serum: 3.6 mg/dL — ABNORMAL LOW (ref 4.5–5.6)

## 2021-10-05 LAB — PHOSPHORUS: Phosphorus: 8.6 mg/dL — ABNORMAL HIGH (ref 2.5–4.6)

## 2021-10-05 LAB — MAGNESIUM: Magnesium: 2.6 mg/dL — ABNORMAL HIGH (ref 1.7–2.4)

## 2021-10-05 MED ORDER — LIP MEDEX EX OINT
TOPICAL_OINTMENT | CUTANEOUS | Status: DC | PRN
  Filled 2021-10-05: qty 7

## 2021-10-05 MED ORDER — CALCIUM GLUCONATE-NACL 2-0.675 GM/100ML-% IV SOLN
2.0000 g | Freq: Once | INTRAVENOUS | Status: AC
  Administered 2021-10-05: 2000 mg via INTRAVENOUS
  Filled 2021-10-05: qty 100

## 2021-10-05 MED ORDER — LORAZEPAM 2 MG/ML IJ SOLN: 1.0000 mg | INTRAMUSCULAR | Status: DC | PRN

## 2021-10-05 MED ORDER — STERILE WATER FOR INJECTION IV SOLN
INTRAVENOUS | Status: DC
  Filled 2021-10-05 (×2): qty 1000

## 2021-10-05 MED ORDER — LACTULOSE 10 GM/15ML PO SOLN
30.0000 g | Freq: Two times a day (BID) | ORAL | Status: DC
  Administered 2021-10-05 – 2021-10-07 (×4): 30 g via ORAL
  Filled 2021-10-05 (×4): qty 45

## 2021-10-05 NOTE — Progress Notes (Addendum)
NAME:  Colin Allen, Colin Allen MRN:  [id number], DOB:  1961-03-08, LOS: 1 ADMISSION DATE:  10/04/2021 CONSULTATION DATE:  10/04/2021 REFERRING MD:  Cinda Quest - [facility] CHIEF COMPLAINT:  AMS, Sepsis  History of Present Illness:  60 year old man who presented to [facility] via EMS 11/9 for AMS, weakness, possible sepsis. Found down post-fall after family had not heard from him x 3 days. Jaundiced and hypotensive on EMS arrival. 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  On ED arrival, vitals notable for HR 77, BP 67/45, afebrile. Labs were notable for WBC 28.5, stable H&H, normal Plt (190). INR elevated 1.5. Na 133, K 4.9, CO2 19, Cr 3.77 (baseline 1.2 2021). Transaminases elevated to 561/177, Tbili 29.6. Ammonia 51, LA 1.8. Trop 74 (69) and BNP 88.7. CK 1913 with concern for rhabdomyolysis. CT Head NAICA. CT A/P notable for irregular 3.3cm pancreatic tail mass c/f neoplasm as well as "innumerable" metastases measuring up to 4.4cm.  Patient was transferred to [facility] 11/9 for further evaluation and management.  On arrival to [facility], patient assessed at bedside. States that he initially fell Friday, 11/4 while carrying groceries but was able to get up. He states that he fell again Sunday, 11/6 but is unclear on how this fall occurred and does not seem to remember being unable to get up; reports that he came to the hospital on his own without EMS assistance. Did not feel dizzy or lightheaded prior to falls and states these were mechanical. Denies fevers or recent illness at home. Occasional SOB for which he utilizes 2L O2 and/or rescue inhaler, daily Symbicort. Denies nausea/vomiting, changes in bowel habits/blood in stool, LE or abdominal swelling. Endorses jaundice at baseline. Patient states that he sees his PCP every 3 months. Has lost around 70lb in a year. Tries to remain on top of his diet (previously with high cholesterol). Reports checking his blood  glucoses TID with meals as well as frequent BPs, but he cannot say what his normal BPs are.  Pertinent Medical History:   Past Medical History:  Diagnosis Date   Back pain    Cirrhosis of liver ([place]) 2010   Colon polyps 02/2014   found 3 polyps   Diabetes mellitus without complication ([place])    Emphysema of lung ([place])    Fractured elbow 1993   right elbow   Hyperlipidemia    Hypertension    Sleep apnea    Thyroid disease    Significant Hospital Events: Including procedures, antibiotic start and stop dates in addition to other pertinent events   11/8 - Presented to [facility] via EMS for AMS after being found down, unknown length of time. C/f rhabdo with elevated CK/AKI. Also with transaminitis + hyperbilirubinemia. CT A/P with new pancreatic tail mass + mets. Started cefepime, and vanc (got one dose flagyl)  11/9 - Transferred to [facility] for further management.Arrived from [place] hypotensive with SBPs 90s Multiple significant lab abnormalities including LFTs, CK/Cr CT A/P with new pancreatic tail mass + ?mets 11/10 making urine. Bicarb gtt placed on hold as Ca dropped. Still on pressors. Renal Fxn worse.   Interim History / Subjective:  Wants something to drink. Feels dry   Objective:  Blood pressure 92/69, pulse 73, temperature 97.6 F (36.4 C), temperature source Oral, resp. rate (Abnormal) 40, weight 102.2 kg, SpO2 97 %.        Intake/Output Summary (Last 24 hours) at 10/05/2021 0749 Last data filed at 10/05/2021  0600 Gross per 24 hour  Intake 4101.03 ml  Output 475 ml  Net 3626.03 ml   Filed Weights   10/04/21 0150 10/05/21 0415  Weight: 97.5 kg 102.2 kg   Physical Examination: General this is a 60 year old male who is lying in bed. He appears jaundice and chronically ill but is in no acute distress HENT Normocephalic. Sclera icteric MM moist. Neck veins flat Pulm clear. Slight tachypnea currently on room air  Card rrr Abd soft not tender Ext warm dry brisk CR Neuro  awake. Oriented X 2 -3. At times confused. Moves all ext. Sp slurred [place] [facility] Problem List:    Assessment & Plan:  Acute Metabolic and Hepatic Encephalopathy s/p fall at home with prolonged downtime -metabolic parameters contributing include: sepsis, acidosis, renal failure, etoh withdrawal -CT neg  Plan Supportive care Scheduled lactulose and xifaxan  Avoid sedating meds if able  PRN Phenobarb for CIWA > 8 w/ escalated dose for CIWA > 15  Concern for septic shock Concern for sepsis in the setting of leukocytosis (WBC 28.5), hypotension and AKI. No focal source yet identified.  Wbc improving  Plan Keep euvolemic  Day 3 vanc and cefepime.  His PCR is negative so stop vanc  Wean norepi off  Severe AKI with likely rhabdomyolysis Found down after ~3 days post-fall. CK 1913 on admission. Cr 3.77 from last known baseline 1.2. McMahon score 6.5. -seen by renal->renal fxn worse Plan Awaiting goals of care  Would need CRRT but given acute decompensated ETOH cirrhosis and also what appears to be pancreatic malignancy he is not good candidate for dialysis   Decompensated EtOH cirrhosis Transaminitis Hyperbilirubinemia Longstanding history of cirrhosis (diagnosied ~2010). Appears to be managed primarily by PCP. Transaminases and Tbili significantly elevated. 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 Plan When stabilized med/onc eval   H/o COPD & OSA Plan Cont symbicort and PRN SABA CPAP at HS   T2DM Plan Ssi  Goal 140-180  H/o Hypertension & Hyperlipidemia Plan Holding home antihypertensives as BP low and holding Lipitor, Lopid and tricor   Anemia w/out evidence of bleeding Plan Trend    Best Practice: (right click and "Reselect all  SmartList Selections" daily)   Diet/type: Regular consistency (see orders) - 2G Na DVT prophylaxis: other - held in the setting of elevated INR GI prophylaxis: PPI Lines: N/A Foley:  N/A Code Status:  full code Last date of multidisciplinary goals of care discussion [Pending]  Critical care time: 32 min    Erick Colace ACNP-BC [place] Pager # [phone] OR # [phone] if no answer

## 2021-10-05 NOTE — Progress Notes (Addendum)
[facility] Progress Note  Name: Colin Allen. MRN: [id number] DOB: 03/13/1961  Chief Complaint:  Found down at home   Subjective:  He had 500 mL uop over 11/9 as well as an unmeasured urine void. He has been altered but less agitated overnight.  Team had initially approached him about line placement and he didn't want it and was agitated so they held off.   Review of systems:  Unable to obtain reliably given ams - he is sleepy this am  -------- Background on referral:   Adalbert Alberto. is a 60 y.o. male who has a PMHx of EtOH abuse with cirrhosis, T2DM, HTN, HLD, who presented to the ED for AMS and found down at home now admitted for acute hepatic encephalopathy and concern for septic shock. 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 Nephrology has been consulted for management of AKI and acidosis      Intake/Output Summary (Last 24 hours) at 10/05/2021 0647 Last data filed at 10/05/2021 0600 Gross per 24 hour  Intake 4101.03 ml  Output 475 ml  Net 3626.03 ml    Vitals:  Vitals:   10/05/21 0415 10/05/21 0500 10/05/21 0515 10/05/21 0600  BP:  105/71 107/69 92/69  Pulse:  81 80 73  Resp:  (!) 24 (!) 40   Temp:      TempSrc:      SpO2:  93% 95% 97%  Weight: 102.2 kg        Physical Exam:  General adult male in bed in no acute distress but confused HEENT normocephalic atraumatic extraocular movements intact; icteric sclera  Neck supple trachea midline Lungs clear to auscultation bilaterally normal work of breathing at rest on room air Heart S1S2 no rub Abdomen soft nontender obese body habitus; ascites Extremities 1+ edema lower extremities Psych - circumferential previously - minimally talkative today Neuro - oriented to only person for me (he provides first name today and then falls back asleep);  Skin jaundice GU no foley   Medications reviewed   Labs:  BMP Latest Ref Rng & Units  10/04/2021 10/04/2021 10/04/2021  Glucose 70 - 99 mg/dL 166(H) 132(H) -  BUN 6 - 20 mg/dL 77(H) 75(H) -  Creatinine 0.61 - 1.24 mg/dL 4.48(H) 4.52(H) -  Sodium 135 - 145 mmol/L 130(L) 133(L) 135  Potassium 3.5 - 5.1 mmol/L 4.2 4.3 4.2  Chloride 98 - 111 mmol/L 97(L) 102 -  CO2 22 - 32 mmol/L 16(L) 14(L) -  Calcium 8.9 - 10.3 mg/dL 6.0(LL) 5.9(LL) -     Assessment/Plan:   # AKI - Secondary to ATN.  He is normally on multiple antihypertensives and was hypotensive when found by EMS.  One agent is an ACE inhibitor - await Am labs but anticipate need for CRRT - discussed with nursing and will follow-up with team - foley was ordered   # Metabolic acidosis - pause bicarb gtt - severe hypocalcemia awaiting repeat Ca - Anticipate need for CRRT soon as above   # Septic shock -On Levophed  - Antibiotics per primary team   # Encephalopathy - metabolic  - multifactorial with AKI and cirrhosis  - if no improvement in encephalopathy with correction of acidosis would consider transition to an alternative to cefepime    # Pancreatic mass - as nephrology I have not discussed this with the patient given AMS.  The patient sister's offers the comment that she was told that the liver looked  like he had evidence of cancer - per primary team    # Cirrhosis - decompensated  - longstanding EtOH abuse per his sister - on lactulose per critical care team    # Alcohol abuse  -Team is monitoring for withdrawal   # Hyperphosphatemia - binder short term with meals and then will need to stop when transition to CRRT  - renal diet when taking PO   # Hypocalcemia - he has required repletion x 4 grams over 11/9 - await renal panel from this AM - intact PTH - anticipate need for calcitriol  - pause bicarb gtt and await repeat labs  # Normocytic anemia  - no acute indication for PRBC's  Claudia Desanctis, MD 10/05/2021 7:11 AM   Spoke with primary team, Dr. Ander Slade.  Given labs I have offered CRRT.  He  is going to touch base again with the family about overall goals of care and will get back with me.  Given decompensated cirrhosis and new concern for malignancy goals of care discussions certainly appropriate   Claudia Desanctis, MD 8:32 AM 10/05/2021   Spoke again with primary team.  No escalation of care at this time - no dialysis.  He is DNR - continue medical measures at this time.    Resume bicarb gtt and replete calcium  Appreciate critical care   Claudia Desanctis, MD 11:23 AM 10/05/2021

## 2021-10-05 NOTE — Progress Notes (Signed)
Discussed further with patient's sister  No escalation  Patient will be made DNR status  Consulted palliative care

## 2021-10-05 NOTE — Progress Notes (Signed)
Appreciate palliative care assistance with care  Discussion had with patient's sister  Goal is to try and transition patient to residential hospice close to his mom  No escalation of care Supportive measures to enable transfer

## 2021-10-05 NOTE — Consult Note (Signed)
Consultation Note Date: 10/05/2021   Patient Name: Colin Allen.  DOB: 18-Apr-1961  MRN: [id number]  Age / Sex: 60 y.o., male  PCP: Marden Noble, MD Referring Physician: Laurin Coder, MD  Reason for Consultation: Establishing goals of care, "AKI, longterm cirrhotic, pancreatic mass with extensive liver mets, encephalopathic" and "GOC, new pancreatic mass + ETOH Cirrhosis"  HPI/Patient Profile: 60 y.o. 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 Patient was found down post-fall after family had not heard from him x 3 days and called EMS. 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  ED Course: On ED arrival, vitals notable for HR 77, BP 67/45, afebrile. Labs were notable for WBC 28.5, stable H&H, normal Plt (190). INR elevated 1.5. Na 133, K 4.9, CO2 19, Cr 3.77 (baseline 1.2 2021). Transaminases elevated to 561/177, Tbili 29.6. Ammonia 51, LA 1.8. Trop 74 (69) and BNP 88.7. CK 1913 with concern for rhabdomyolysis. CT Head NAICA. CT A/P notable for irregular 3.3cm pancreatic tail mass c/f neoplasm as well as "innumerable" metastases measuring up to 4.4cm.  Clinical Assessment and Goals of Care: I have reviewed medical records including EPIC notes, labs, and imaging. Received report from primary RN - no acute concerns. Reports patient has been lethargic all day.    Went to visit patient at bedside - sister/Wendy present. Patient was lying in bed awake, alert, oriented, and able to participate in conversation; however, he does not seem to be able to  [facility] complex medical decisions. No signs or non-verbal gestures of pain or discomfort noted. No respiratory distress, increased work of breathing, or secretions noted. Patient denies pain or shortness of breath.  Met with patient and sister  to discuss diagnosis, prognosis, GOC, EOL wishes, disposition, and options.  I introduced Palliative Medicine as specialized medical care for people living with serious illness. It focuses on providing relief from the symptoms and stress of a serious illness. The goal is to improve quality of life for both the patient and the family.  Patient stated he was too tired to meet today and "wanted to take a nap." Met with sister/Wendy in conference room.  We discussed a brief life review of the patient as well as functional and nutritional status. Patient was not working, he is on disability. He is not married but does have 2 daughters, whom Abigail Butts tells me are estranged and not involved in his life, but she spoke with daughters yesterday to update them on patient's situation. Prior to hospitalization, patient was living in a private residence alone. Abigail Butts tells me he has had many falls over the last year, which include 3 falls this month that she is aware of. Patient did not have any home health services -  he was able to independently perform all ADLs but due to his excessive drinking likely did have issues Abigail Butts explains. Abigail Butts first started to notice a decline in patient about 2 weeks ago, which is when he started complaining of "feeling bad." She does report seeing patient 6 months ago and noting his "yellow eyes." Abigail Butts tells me she is not sure if patient was diagnosed with cancer prior this hospitalization - she tried to speak with patient about it, but he was not clear in his response. Abigail Butts has been updating their mother on patient's current medical situation.  We discussed patient's current illness and what it means in the larger context of patient's  on-going co-morbidities.  Abigail Butts has a clear understanding of patient's current acute medical situation. Natural disease trajectory and expectations at EOL were discussed. I attempted to elicit values and goals of care important to the patient. The difference between aggressive medical intervention and comfort care was considered in light of the patient's goals of care. Abigail Butts is familiar with hospice services. Therapeutic listening provided as she reviewed the loss of several other family members over the last several years. Her goal is to get the patient to residential hospice, requesting [facility] as soon as possible. She tells me she has also discussed this with the patient. Their mother is in a rehab facility close to this location and family are hopeful he can be transferred while he is still having wakeful moments so she can visit. Due to the mother's broken hip, traveling long distances is hard for her. While in house, the goal is to keep the patient stable for transfer. If he declines, sister is open for transition to full comfort measures, they do not want to escalate care. No CRRT.   Advance directives, concepts specific to code status, and rehospitalization were considered and discussed. Patient does not have Living Will or HCPOA. Education provided to Abigail Butts that legally, patient's daughters would be decision makers without HCPOA - explained I would attempt to speak with patient on whom he would want to be his medical decision maker. Abigail Butts states she has updated daughters on current situation and they are not oppose to hospice. Confirmed DNR/DNI.   Discussed with patient/family the importance of continued conversation with each other and the medical providers regarding overall plan of care and treatment options, ensuring decisions are within the context of the patient's values and GOCs.    Went back to patient's bedside - he was lying in bed asleep. He does wake to voice/gentle touch.  Patient tells me he would want Abigail Butts to be his surrogate decision maker "like a POA."   Offered chaplain services - kindly declined.  Questions and concerns were addressed. The patient/family was encouraged to call with questions and/or concerns. PMT card was provided.  Provided verbal updates to primary RN and Dr. Ander Slade.  Primary Decision Maker: OTHER - patient states he wants his sister/Wendy Rollene Rotunda to be his surrogate decision maker    SUMMARY OF RECOMMENDATIONS   Continue current supportive medical interventions with goal to keep patient stable for hospice transfer - if decline, no escalation of care, family agreeable for transition to full comfort measures No CRRT Continue DNR/DNI Family's goal is for patient to transfer to [facility] as soon as possible - TOC and [place] liaison notified; TOC consult placed Patient wants sister/Wendy to be surrogate decision maker PMT will continue to follow and support holistically   Code Status/Advance Care Planning: DNR  Palliative Prophylaxis:  Aspiration, Bowel Regimen, Delirium Protocol, Frequent Pain Assessment, Oral Care, and Turn Reposition  Additional Recommendations (Limitations, Scope, Preferences): No Artificial Feeding, No Chemotherapy, No Hemodialysis, No Surgical Procedures, and No Tracheostomy  Psycho-social/Spiritual:  Desire for further Chaplaincy support:no Created space and opportunity for patient and family to express thoughts and feelings regarding patient's current medical situation.  Emotional support and therapeutic listening provided.  Prognosis:  < 2 weeks  Discharge Planning: Hospice facility      Primary Diagnoses: Present on Admission: **None**   I have reviewed the medical record, interviewed the patient and family, and examined the patient. The following aspects are pertinent.  Past Medical History:  Diagnosis Date   Back pain    Cirrhosis of liver ([place]) 2010   Colon polyps  02/2014   found 3 polyps   Diabetes mellitus without complication (HCC)    Emphysema of lung ([place])    Fractured elbow 1993   right elbow   Hyperlipidemia    Hypertension    Sleep apnea    Thyroid disease    Social History   Socioeconomic History   Marital status: Divorced    Spouse name: Not on file   Number of children: Not on file   Years of education: Not on file   Highest education level: Not on file  Occupational History   Not on file  Tobacco Use   Smoking status: Former    Packs/day: 1.00    Years: 35.00    Pack years: 35.00    Types: Cigarettes   Smokeless tobacco: Not on file  Substance and Sexual Activity   Alcohol use: No   Drug use: No   Sexual activity: Not on file  Other Topics Concern   Not on file  Social History Narrative   Not on file   Social Determinants of Health   Financial Resource Strain: Not on file  Food Insecurity: Not on file  Transportation Needs: Not on file  Physical Activity: Not on file  Stress: Not on file  Social Connections: Not on file   Family History  Problem Relation Age of Onset   Cancer Father        lung   Cancer Maternal Grandmother        unknown   Cancer Paternal Grandmother        unknown   Cancer Paternal Grandfather        unsure if it was colon or prostate   Scheduled Meds:  chlorhexidine  15 mL Mouth Rinse BID   Chlorhexidine Gluconate Cloth  6 each Topical Daily   folic acid  1 mg Oral Daily   insulin aspart  0-15 Units Subcutaneous Q4H   lactulose  30 g Oral BID   levothyroxine  200 mcg Oral QAC breakfast   mouth rinse  15 mL Mouth Rinse q12n4p   mometasone-formoterol  2 puff Inhalation BID   multivitamin with minerals  1 tablet Oral Daily   polyethylene glycol  17 g Oral Daily   rifaximin  550 mg Oral BID   sevelamer carbonate  800 mg Oral TID WC   thiamine  100 mg Oral Daily   vancomycin variable dose per unstable renal function (pharmacist dosing)   Does not apply See admin instructions    zinc sulfate  220 mg Oral Daily   Continuous Infusions:  sodium chloride     ceFEPime (MAXIPIME) IV Stopped (10/04/21 1922)   norepinephrine (LEVOPHED) Adult infusion 2 mcg/min (10/05/21 0600)  PHENObarbital      sodium bicarbonate (isotonic) infusion in sterile water 75 mL/hr at 10/05/21 1223   PRN Meds:.albuterol, docusate sodium, hydrOXYzine, lip balm, loperamide, ondansetron, PHENObarbital, PHENObarbital Medications Prior to Admission:  Prior to Admission medications   Medication Sig Start Date End Date Taking? Authorizing Provider  albuterol (VENTOLIN HFA) 108 (90 Base) MCG/ACT inhaler Inhale 2 puffs into the lungs every 6 (six) hours as needed. 11/30/14   [provider]  amLODipine (NORVASC) 10 MG tablet Take 10 mg by mouth daily. 06/19/21   [provider]  atorvastatin (LIPITOR) 40 MG tablet Take 40 mg by mouth at bedtime. 08/07/21   [provider]  budesonide-formoterol (SYMBICORT) 160-4.5 MCG/ACT inhaler Inhale 1 puff into the lungs 2 (two) times daily.    [provider]  fenofibrate (TRICOR) 145 MG tablet Take 145 mg by mouth at bedtime. 08/07/21   [provider]  folic acid (FOLVITE) 1 MG tablet Take 1 mg by mouth daily. 08/07/21   [provider]  gemfibrozil (LOPID) 600 MG tablet Take 600 mg by mouth 2 (two) times daily before a meal. Patient not taking: No sig reported    [provider]  HUMALOG MIX 75/25 KWIKPEN (75-25) 100 UNIT/ML KwikPen Inject 40 Units into the skin 2 (two) times daily. 08/08/21   [provider]  Insulin Aspart Prot & Aspart (NOVOLOG MIX 70/30 [place]) Inject 35 Units into the skin daily. Patient not taking: No sig reported    [provider]  LANTUS SOLOSTAR 100 UNIT/ML Solostar Pen Inject 60 Units into the skin at bedtime. 06/20/21   [provider]  levothyroxine (SYNTHROID, LEVOTHROID) 200 MCG tablet Take 200 mcg by mouth daily before breakfast.    [provider]  linaclotide (LINZESS) 290 MCG CAPS capsule Take 290 mcg by mouth daily. Patient not taking: No sig reported    [provider]  lisinopril (PRINIVIL,ZESTRIL) 10 MG tablet Take 10 mg by mouth daily. Patient not taking: No sig reported    [provider]  metoprolol succinate (TOPROL-XL) 25 MG 24 hr tablet Take 25 mg by mouth every morning. 08/07/21   [provider]  oxyCODONE-acetaminophen (PERCOCET) 7.5-325 MG per tablet Take 1 tablet by mouth every 6 (six) hours as needed for pain. Patient not taking: No sig reported    [provider]  terazosin (HYTRIN) 5 MG capsule Take 5 mg by mouth daily. 08/07/21   [provider]  Vitamin D, Ergocalciferol, (DRISDOL) 1.25 MG (50000 UNIT) CAPS capsule Take 50,000 Units by mouth every 30 (thirty) days. 06/22/21   [provider]  zolpidem (AMBIEN) 10 MG tablet Take 10 mg by mouth at bedtime as needed for sleep.    [provider]   Allergies  Allergen Reactions   Lisinopril Swelling    Other reaction(s): Angioedema    Bee Venom Swelling   Acetaminophen Other (See Comments)    Liver disease Liver disease    Review of Systems  Constitutional:  Positive for activity change, appetite change and fatigue.  Gastrointestinal:  Negative for nausea and vomiting.  Neurological:  Positive for weakness.  All other systems reviewed and are negative.  Physical Exam Vitals and nursing note reviewed.  Constitutional:      General: He is not in acute distress. Pulmonary:     Effort: No respiratory distress.  Skin:    General: Skin is warm and dry.  Neurological:     Mental Status: He is alert and  oriented to person, place, and time.  Psychiatric:        Attention and Perception: Attention normal.        Behavior: Behavior is cooperative.        Cognition and Memory: Cognition and memory normal.    Vital Signs: BP 97/62   Pulse 76   Temp 98.9 F (37.2 C) (Axillary)   Resp (!) 34    Wt 102.2 kg   SpO2 97%   BMI 30.56 kg/m  Pain Scale: 0-10   Pain Score: 0-No pain   SpO2: SpO2: 97 % O2 Device:SpO2: 97 % O2 Flow Rate: .   IO: Intake/output summary:  Intake/Output Summary (Last 24 hours) at 10/05/2021 1430 Last data filed at 10/05/2021 1300 Gross per 24 hour  Intake 2899.08 ml  Output 535 ml  Net 2364.08 ml    LBM:   Baseline Weight: Weight: 97.5 kg Most recent weight: Weight: 102.2 kg     Palliative Assessment/Data: PPS 20%     Time In: 1500 Time Out: 1620 Time Total: 80 minutes Greater than 50%  of this time was spent counseling and coordinating care related to the above assessment and plan.  Signed by: Lin Landsman, NP   Please contact Palliative Medicine Team phone at [phone] for questions and concerns.  For individual provider: See Shea Evans

## 2021-10-05 NOTE — Progress Notes (Signed)
[facility] Liaison note.   Received request from [place] for family interest in [place].    Hospice Home notified of request and patient chart is under review. A representative will follow up with [facility] manager and family.   Please do not hesitate to call with questions.        Thank you,    Clementeen Hoof, BSN, RN [place] (listed on AMION under Hospice and [place] [facility] [phone]   [phone]

## 2021-10-06 DIAGNOSIS — N179 Acute kidney failure, unspecified: Secondary | ICD-10-CM | POA: Diagnosis not present

## 2021-10-06 DIAGNOSIS — R4182 Altered mental status, unspecified: Secondary | ICD-10-CM | POA: Diagnosis not present

## 2021-10-06 DIAGNOSIS — A419 Sepsis, unspecified organism: Principal | ICD-10-CM

## 2021-10-06 DIAGNOSIS — K7031 Alcoholic cirrhosis of liver with ascites: Secondary | ICD-10-CM | POA: Diagnosis not present

## 2021-10-06 DIAGNOSIS — R6521 Severe sepsis with septic shock: Secondary | ICD-10-CM | POA: Diagnosis not present

## 2021-10-06 DIAGNOSIS — Z515 Encounter for palliative care: Secondary | ICD-10-CM | POA: Diagnosis not present

## 2021-10-06 DIAGNOSIS — Z7189 Other specified counseling: Secondary | ICD-10-CM | POA: Diagnosis not present

## 2021-10-06 LAB — CBC WITH DIFFERENTIAL/PLATELET
Abs Immature Granulocytes: 0.27 10*3/uL — ABNORMAL HIGH (ref 0.00–0.07)
Basophils Absolute: 0 10*3/uL (ref 0.0–0.1)
Basophils Relative: 0 %
Eosinophils Absolute: 0.1 10*3/uL (ref 0.0–0.5)
Eosinophils Relative: 1 %
HCT: 27 % — ABNORMAL LOW (ref 39.0–52.0)
Hemoglobin: 9.2 g/dL — ABNORMAL LOW (ref 13.0–17.0)
Immature Granulocytes: 2 %
Lymphocytes Relative: 8 %
Lymphs Abs: 1.1 10*3/uL (ref 0.7–4.0)
MCH: 31.4 pg (ref 26.0–34.0)
MCHC: 34.1 g/dL (ref 30.0–36.0)
MCV: 92.2 fL (ref 80.0–100.0)
Monocytes Absolute: 0.9 10*3/uL (ref 0.1–1.0)
Monocytes Relative: 6 %
Neutro Abs: 11.7 10*3/uL — ABNORMAL HIGH (ref 1.7–7.7)
Neutrophils Relative %: 83 %
Platelets: 136 10*3/uL — ABNORMAL LOW (ref 150–400)
RBC: 2.93 MIL/uL — ABNORMAL LOW (ref 4.22–5.81)
RDW: 16.3 % — ABNORMAL HIGH (ref 11.5–15.5)
WBC: 14.1 10*3/uL — ABNORMAL HIGH (ref 4.0–10.5)
nRBC: 0.1 % (ref 0.0–0.2)

## 2021-10-06 LAB — BASIC METABOLIC PANEL
Anion gap: 19 — ABNORMAL HIGH (ref 5–15)
BUN: 85 mg/dL — ABNORMAL HIGH (ref 6–20)
CO2: 17 mmol/L — ABNORMAL LOW (ref 22–32)
Calcium: 5.7 mg/dL — CL (ref 8.9–10.3)
Chloride: 95 mmol/L — ABNORMAL LOW (ref 98–111)
Creatinine, Ser: 5.34 mg/dL — ABNORMAL HIGH (ref 0.61–1.24)
GFR, Estimated: 12 mL/min — ABNORMAL LOW (ref >60)
Glucose, Bld: 123 mg/dL — ABNORMAL HIGH (ref 70–99)
Potassium: 3.4 mmol/L — ABNORMAL LOW (ref 3.5–5.1)
Sodium: 131 mmol/L — ABNORMAL LOW (ref 135–145)

## 2021-10-06 LAB — PTH, INTACT AND CALCIUM
Calcium, Total (PTH): 6.1 mg/dL — CL (ref 8.7–10.2)
PTH: 176 pg/mL — ABNORMAL HIGH (ref 15–65)

## 2021-10-06 LAB — CALCIUM, IONIZED: Calcium, Ionized, Serum: 3.3 mg/dL — ABNORMAL LOW (ref 4.5–5.6)

## 2021-10-06 LAB — MAGNESIUM: Magnesium: 2.4 mg/dL (ref 1.7–2.4)

## 2021-10-06 MED ORDER — CALCITRIOL 0.25 MCG PO CAPS
0.2500 ug | ORAL_CAPSULE | Freq: Every day | ORAL | Status: DC
  Administered 2021-10-06 – 2021-10-07 (×2): 0.25 ug via ORAL
  Filled 2021-10-06 (×2): qty 1

## 2021-10-06 MED ORDER — OXYCODONE HCL 5 MG PO TABS
5.0000 mg | ORAL_TABLET | Freq: Four times a day (QID) | ORAL | Status: DC | PRN
  Administered 2021-10-06 – 2021-10-08 (×7): 5 mg via ORAL
  Filled 2021-10-06 (×9): qty 1

## 2021-10-06 MED ORDER — WHITE PETROLATUM EX OINT
TOPICAL_OINTMENT | CUTANEOUS | Status: DC | PRN
  Filled 2021-10-06: qty 28.35

## 2021-10-06 MED ORDER — FENTANYL CITRATE (PF) 100 MCG/2ML IJ SOLN
25.0000 ug | INTRAMUSCULAR | Status: DC | PRN
  Administered 2021-10-06 – 2021-10-07 (×2): 25 ug via INTRAVENOUS
  Filled 2021-10-06 (×2): qty 2

## 2021-10-06 MED ORDER — CALCIUM GLUCONATE-NACL 1-0.675 GM/50ML-% IV SOLN
1.0000 g | Freq: Once | INTRAVENOUS | Status: AC
  Administered 2021-10-06: 1000 mg via INTRAVENOUS
  Filled 2021-10-06: qty 50

## 2021-10-06 MED ORDER — SODIUM CHLORIDE 0.9 % IV SOLN: 250.0000 mL | INTRAVENOUS | Status: DC

## 2021-10-06 MED ORDER — CALCIUM GLUCONATE-NACL 2-0.675 GM/100ML-% IV SOLN
2.0000 g | Freq: Once | INTRAVENOUS | Status: DC
  Filled 2021-10-06: qty 100

## 2021-10-06 MED ORDER — NOREPINEPHRINE 4 MG/250ML-% IV SOLN
2.0000 ug/min | INTRAVENOUS | Status: DC
  Administered 2021-10-06: 13 ug/min via INTRAVENOUS
  Administered 2021-10-06: 9 ug/min via INTRAVENOUS
  Administered 2021-10-07: 13 ug/min via INTRAVENOUS
  Administered 2021-10-07: 10 ug/min via INTRAVENOUS
  Administered 2021-10-07 (×2): 13 ug/min via INTRAVENOUS
  Administered 2021-10-08: 4 ug/min via INTRAVENOUS
  Filled 2021-10-06 (×8): qty 250

## 2021-10-06 MED ORDER — POTASSIUM CHLORIDE CRYS ER 20 MEQ PO TBCR
20.0000 meq | EXTENDED_RELEASE_TABLET | Freq: Once | ORAL | Status: AC
  Administered 2021-10-06: 20 meq via ORAL
  Filled 2021-10-06: qty 1

## 2021-10-06 NOTE — Progress Notes (Signed)
This chaplain responded to PMT consult for EOL spiritual care.  The chaplain understands from conversation with the Pt. RN-Sarah, the Pt. sister-Wendy is bringing the Pt. mother to the hospital.  At that time, the chaplain understands the Pt. will be transitioned to comfort care.  The chaplain offered a spiritual care presence.  The chaplain understands the RN will communicate a request for spiritual care as needed.   Chaplain Sallyanne Kuster [phone]

## 2021-10-06 NOTE — Progress Notes (Signed)
[facility] Progress Note Patient Name: Colin Allen. DOB: January 04, 1961 MRN: [id number]   Date of Service  10/06/2021  HPI/Events of Note  PM labs reviewed, AM labs ordered.  eICU Interventions  See above.        Kerry Kass Mabrey Howland 10/06/2021, 12:19 AM

## 2021-10-06 NOTE — Progress Notes (Signed)
NAME:  Colin Allen, Colin Allen MRN:  [id number], DOB:  [birth year], LOS: 2 ADMISSION DATE:  10/04/2021 CONSULTATION DATE:  10/04/2021 REFERRING MD:  Cinda Quest - [facility] CHIEF COMPLAINT:  AMS, Sepsis  History of Present Illness:  60 year old man who presented to [facility] via EMS 11/9 for AMS, weakness, possible sepsis. Found down post-fall after family had not heard from him x 3 days. Jaundiced and hypotensive on EMS arrival. 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  On ED arrival, vitals notable for HR 77, BP 67/45, afebrile. Labs were notable for WBC 28.5, stable H&H, normal Plt (190). INR elevated 1.5. Na 133, K 4.9, CO2 19, Cr 3.77 (baseline 1.2 2021). Transaminases elevated to 561/177, Tbili 29.6. Ammonia 51, LA 1.8. Trop 74 (69) and BNP 88.7. CK 1913 with concern for rhabdomyolysis. CT Head NAICA. CT A/P notable for irregular 3.3cm pancreatic tail mass c/f neoplasm as well as "innumerable" metastases measuring up to 4.4cm.  Patient was transferred to [facility] 11/9 for further evaluation and management.  On arrival to [facility], patient assessed at bedside. States that he initially fell Friday, 11/4 while carrying groceries but was able to get up. He states that he fell again Sunday, 11/6 but is unclear on how this fall occurred and does not seem to remember being unable to get up; reports that he came to the hospital on his own without EMS assistance. Did not feel dizzy or lightheaded prior to falls and states these were mechanical. Denies fevers or recent illness at home. Occasional SOB for which he utilizes 2L O2 and/or rescue inhaler, daily Symbicort. Denies nausea/vomiting, changes in bowel habits/blood in stool, LE or abdominal swelling. Endorses jaundice at baseline. Patient states that he sees his PCP every 3 months. Has lost around 70lb in a year. Tries to remain on top of his diet (previously with high cholesterol). Reports checking his blood  glucoses TID with meals as well as frequent BPs, but he cannot say what his normal BPs are.  Pertinent Medical History:   Past Medical History:  Diagnosis Date   Back pain    Cirrhosis of liver ([place]) 2010   Colon polyps 02/2014   found 3 polyps   Diabetes mellitus without complication ([place])    Emphysema of lung ([place])    Fractured elbow 1993   right elbow   Hyperlipidemia    Hypertension    Sleep apnea    Thyroid disease    Significant Hospital Events: Including procedures, antibiotic start and stop dates in addition to other pertinent events   11/8 - Presented to [facility] via EMS for AMS after being found down, unknown length of time. C/f rhabdo with elevated CK/AKI. Also with transaminitis + hyperbilirubinemia. CT A/P with new pancreatic tail mass + mets. Started cefepime, and vanc (got one dose flagyl)  11/9 - Transferred to [facility] for further management.Arrived from [place] hypotensive with SBPs 90s Multiple significant lab abnormalities including LFTs, CK/Cr CT A/P with new pancreatic tail mass + ?mets 11/10 making urine. Bicarb gtt placed on hold as Ca dropped. Still on pressors. Renal Fxn worse.   Interim History / Subjective:  Patient became hypotensive overnight, requiring increasing dose of Levophed  Objective:  Blood pressure (!) 88/51, pulse 78, temperature 98.7 F (37.1 C), temperature source Oral, resp. rate (!) 30, weight 104 kg, SpO2 97 %.        Intake/Output Summary (Last 24 hours) at 10/06/2021 1412 Last data  filed at 10/06/2021 1300 Gross per 24 hour  Intake 1996.57 ml  Output 1225 ml  Net 771.57 ml   Filed Weights   10/04/21 0150 10/05/21 0415 10/06/21 0448  Weight: 97.5 kg 102.2 kg 104 kg   Physical Examination: General critically ill looking middle-age Caucasian male who is lying in bed. He appears jaundice HENT Normocephalic. Sclera icteric MM moist. Neck veins flat Pulm clear. Slight tachypnea currently on room air  Card rrr Abd soft not  tender Ext warm dry brisk CR Neuro awake. Oriented X 2 -3. At times confused. Moves all ext. 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 COPD, not in exacerbation & OSA T2DM Anemia of critical illness w/out evidence of bleeding Hyponatremia Hypokalemia Acute metabolic acidosis  After extensive discussion with patient and his sister, they decided to proceed with comfort care after patient's mom visited him today He does not want to continue with aggressive treatment or procedures Currently he is on Levophed, he would want to continue for now until he meets with every 1 before he can be started on comfort care Appreciate nephrology, palliative care and hospice care follow-up  Best Practice: (right click and "Reselect all SmartList Selections" daily)   Diet/type: Regular consistency (see orders) - 2G Na DVT prophylaxis: other - held in the setting of elevated INR GI prophylaxis: PPI Lines: N/A Foley:  N/A Code Status:  full code Last date of multidisciplinary goals of care discussion [Pending]     Jacky Kindle MD [facility] for pager If no response to pager, please call [phone] until 7pm After 7pm, Please call E-link [phone]

## 2021-10-06 NOTE — Progress Notes (Signed)
[facility] Progress Note  Name: Colin Allen. MRN: [id number] DOB: [birth year]  Chief Complaint:  Found down at home   Subjective:  He had 1.3 L uop over 11/10 as well as an unmeasured void.  Family elected not to escalate care and to defer renal replacement therapy yesterday.  His sister is at bedside and confirms this decision with me this am.  He feels better today. He has been on levo at 5 mcg/min.  Review of systems:  He is much more awake today Uncomfortable in bed Denies n/v Denies shortness of breath   -------- Background on referral:   Colin Allen. is a 60 y.o. male who has a PMHx of EtOH abuse with cirrhosis, T2DM, HTN, HLD, who presented to the ED for AMS and found down at home now admitted for acute hepatic encephalopathy and concern for septic shock. 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 Nephrology has been consulted for management of AKI and acidosis      Intake/Output Summary (Last 24 hours) at 10/06/2021 0652 Last data filed at 10/06/2021 0600 Gross per 24 hour  Intake 1876.57 ml  Output 1275 ml  Net 601.57 ml    Vitals:  Vitals:   10/06/21 0600 10/06/21 0615 10/06/21 0630 10/06/21 0645  BP: (!) 71/45 (!) 72/46 (!) 84/53 (!) 74/44  Pulse: 81 79 81 79  Resp: (!) 31 (!) 31 (!) 29 (!) 33  Temp:      TempSrc:      SpO2: 97% 96% 97% 95%  Weight:         Physical Exam:  General adult male in bed in no acute distress HEENT normocephalic atraumatic extraocular movements intact; icteric sclera  Neck supple trachea midline Lungs clear to auscultation bilaterally normal work of breathing at rest on room air Heart S1S2 no rub Abdomen soft nontender obese body habitus; ascites Extremities 1+ edema lower extremities Psych - calm mood and affect Neuro - oriented to person, year, and location Skin - jaundice GU no foley; condom catheter  Medications reviewed   Labs:  BMP  Latest Ref Rng & Units 10/06/2021 10/05/2021 10/05/2021  Glucose 70 - 99 mg/dL 123(H) - 112(H)  BUN 6 - 20 mg/dL 85(H) - 80(H)  Creatinine 0.61 - 1.24 mg/dL 5.34(H) 5.41(H) 4.75(H)  Sodium 135 - 145 mmol/L 131(L) - 131(L)  Potassium 3.5 - 5.1 mmol/L 3.4(L) - 3.6  Chloride 98 - 111 mmol/L 95(L) - 96(L)  CO2 22 - 32 mmol/L 17(L) - 18(L)  Calcium 8.9 - 10.3 mg/dL 5.7(LL) - 6.1(LL)     Assessment/Plan:   # AKI - Secondary to ATN.  He is normally on multiple antihypertensives and was hypotensive when found by EMS.  One agent is an ACE inhibitor - No escalation in care - not starting renal replacement therapy per pulm discussion with family.  This is in the setting of decompensated cirrhosis and new metastatic malignancy     # Metabolic acidosis - pause bicarb gtt again - severe hypocalcemia    # Septic shock -On Levophed  - Antibiotics per primary team discretion   # Encephalopathy - metabolic  - multifactorial with AKI and cirrhosis    # Pancreatic mass - as nephrology I have not discussed this with the patient given AMS.  The patient sister's offers the comment that she was told that the liver looked like he had evidence of cancer - per primary team    #  Cirrhosis - decompensated  - longstanding EtOH abuse per his sister - on lactulose per critical care team  - optimize blood pressure as above   # Alcohol abuse  -Team is monitoring for withdrawal   # Hyperphosphatemia - binder with meals - renal diet  - improving a bit   # Hypocalcemia - replete 2 gram this AM - pause bicarb - intact PTH pending  - start empiric calcitriol at 0.25 mcg daily for now  # Normocytic anemia  - no acute indication for PRBC's - add on iron panel   Claudia Desanctis, MD 10/06/2021 7:13 AM

## 2021-10-06 NOTE — Progress Notes (Signed)
At bedside to place USG PIV for vasoactive drug. Megan, RN at bedside states that patient does not need Levophed and USG PIV not needed at this time. Will continue to monitor.

## 2021-10-06 NOTE — Progress Notes (Signed)
[place] Progress Note Patient Name: Colin Allen. DOB: 17-Feb-1961 MRN: [id number]   Date of Service  10/06/2021  HPI/Events of Note  Patient complaining of severe back pain.  eICU Interventions  Limited dose PRN Oxycodone 5 mg tabs ordered. Ativan discontinued.        Kerry Kass Marga Gramajo 10/06/2021, 4:36 AM

## 2021-10-06 NOTE — Progress Notes (Signed)
Daily Progress Note   Patient Name: Colin Allen.       Date: 10/06/2021 DOB: 12-Feb-1961  Age: 60 y.o. MRN#: [id number] Attending Physician: Jacky Kindle, MD Primary Care Physician: Marden Noble, MD Admit Date: 10/04/2021  Reason for Consultation/Follow-up: Establishing goals of care  Subjective: Chart review performed. Received report from primary RN - acute concern of dropping BP with increasing levophed support, now on 31mcg.  Spoke with [facility] liaison about when bed will likely become available - was told likely tomorrow. I anticipate patient will not be stable for transfer at that time.   Went to visit patient at bedside - sister/Colin Allen present. Patient was lying in bed awake, alert, oriented, and able to participate in conversation. No signs or non-verbal gestures of pain or discomfort noted. No respiratory distress, increased work of breathing, or secretions noted. He states that oxycodone is not managing his pain well. Reviewed balance of utilizing opioids with hypotension while not on full comfort care - discussed starting low dose fentanyl - patient and sister were agreeable.  Updated patient and Colin Allen that hospice bed will likely not be ready until tomorrow. Reviewed patient's decreasing blood pressure with increased need for levo support. We discussed that patient's window for low risk transfer has likely passed, and it is anticipated he will continue to decline making a transfer tomorrow very risky. Patient states that he does not wish to risk transfer and is agreeable to remain in house for EOL.   We talked about transition to comfort measures in house and what that would entail inclusive of medications to control pain, dyspnea, agitation, nausea, and itching. We discussed  stopping all unnecessary measures such as blood draws, needle sticks, oxygen, antibiotics, CBGs/insulin, cardiac monitoring, IVF, and frequent vital signs. Goal is still for patient's mother to visit and to have paperwork notarized prior to transition to full comfort. Patient is ok with increasing levo for blood pressure support if needed to keep him stable to accomplish these goals - no other escalation of care.   Patient and Colin Allen asked about completing HCPOA. Offered chaplain support; however, after discussion, they will not pursue as patient's goals are clear, daughter's are respectful of his wishes, and supportive of Colin Allen being his surrogate Media planner. They ask if financial POA can be completed - unfortunately, notaries  in the hospital are only able to assist in completing [facility] [place] [facility] [place].   Offered chaplain for emotional support - they were agreeable.  Reviewed with Colin Allen that once levo is stopped, patient will likely pass away quickly.  Therapeutic listening and emotional support provided throughout visit.   All questions and concerns addressed. Encouraged to call with questions and/or concerns. PMT card previously provided.  Updated medical team and hospice liaison in change of goals.   Length of Stay: 2  Current Medications: Scheduled Meds:  . calcitRIOL  0.25 mcg Oral Daily  . Chlorhexidine Gluconate Cloth  6 each Topical Daily  . lactulose  30 g Oral BID  . polyethylene glycol  17 g Oral Daily    Continuous Infusions: . sodium chloride 10 mL/hr at 10/06/21 1102  . norepinephrine (LEVOPHED) Adult infusion 9 mcg/min (10/06/21 1102)    PRN Meds: fentaNYL (SUBLIMAZE) injection, lip balm, ondansetron, oxyCODONE  Physical Exam Vitals and nursing note reviewed.  Constitutional:      General: He is not in acute distress.    Appearance: He is ill-appearing.  Pulmonary:     Effort: No respiratory distress.  Skin:    General: Skin is warm and dry.      Coloration: Skin is jaundiced.  Neurological:     Mental Status: He is alert and oriented to person, place, and time.     Motor: Weakness present.  Psychiatric:        Attention and Perception: Attention normal.        Behavior: Behavior is cooperative.        Cognition and Memory: Cognition and memory normal.            Vital Signs: BP (!) 94/56   Pulse 88   Temp 98.7 F (37.1 C) (Oral)   Resp (!) 31   Wt 104 kg   SpO2 96%   BMI 31.10 kg/m  SpO2: SpO2: 96 % O2 Device: O2 Device: Room Air O2 Flow Rate:    Intake/output summary:  Intake/Output Summary (Last 24 hours) at 10/06/2021 1151 Last data filed at 10/06/2021 0800 Gross per 24 hour  Intake 2126.57 ml  Output 1625 ml  Net 501.57 ml   LBM:   Baseline Weight: Weight: 97.5 kg Most recent weight: Weight: 104 kg       Palliative Assessment/Data: PPS 20%    Flowsheet Rows    Flowsheet Row Most Recent Value  Intake Tab   Referral Department Hospitalist  Unit at Time of Referral ICU  Palliative Care Primary Diagnosis Cancer  Date Notified 10/05/21  Palliative Care Type New Palliative care  Reason for referral Clarify Goals of Care  Date of Admission 10/04/21  Date first seen by Palliative Care 10/05/21  # of days Palliative referral response time 0 Day(s)  # of days IP prior to Palliative referral 1  Clinical Assessment   Psychosocial & Spiritual Assessment   Palliative Care Outcomes   Patient/Family meeting held? Yes  Who was at the meeting? patient, sister  Palliative Care Outcomes Clarified goals of care, Counseled regarding hospice, Provided advance care planning, Provided psychosocial or spiritual support, Transitioned to hospice       Patient Active Problem List   Diagnosis Date Noted  . Altered mental status 10/04/2021  . Muscle pain 07/30/2014    Palliative Care Assessment & Plan   Patient Profile: 60 y.o. male  with past medical history of HTN, HLD, T2DM, COPD, OSA, EtOH cirrhosis, new  diagnosis of  pancreatic tail mass (3.3cm) with likely metastases presented to ED on 10/04/21 from home. Patient was found down post-fall after family had not heard from him x 3 days and called EMS. 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   ED Course: On ED arrival, vitals notable for HR 77, BP 67/45, afebrile. Labs were notable for WBC 28.5, stable H&H, normal Plt (190). INR elevated 1.5. Na 133, K 4.9, CO2 19, Cr 3.77 (baseline 1.2 2021). Transaminases elevated to 561/177, Tbili 29.6. Ammonia 51, LA 1.8. Trop 74 (69) and BNP 88.7. CK 1913 with concern for rhabdomyolysis. CT Head NAICA. CT A/P notable for irregular 3.3cm pancreatic tail mass c/f neoplasm as well as "innumerable" metastases measuring up to 4.4cm.  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 Goal is to keep patient stable until his mother can arrive to hospital and while they are attempting to get documents notarized - patient agreeable to increase levo as needed to keep stable while trying to achieve goals, otherwise, do not escalate care if decline  Family will notify PMT or RN when ready for transition to full comfort measures - may be today vs tomorrow 11/12 Patient is no longer stable for transfer to hospice facility, anticipate quick decline once levo is stopped - patient agreeable to remain in house for EOL Continue DNR/DNI Chaplain consulted for: prayer and emotional support for patient and family Fentanyl 28mcg q2h PRN pain PMT will  continue to follow and support holistically  Goals of Care and Additional Recommendations: Limitations on Scope of Treatment: Minimize Medications, Full Scope Treatment, No Artificial Feeding, No Chemotherapy, No Hemodialysis, No Radiation, No Surgical Procedures, and No Tracheostomy  Code Status:    Code Status Orders  (From admission, onward)           Start     Ordered   10/05/21 1109  Do not attempt resuscitation (DNR)  Continuous       Question Answer Comment  In the event of cardiac or respiratory ARREST Do not call a "code blue"   In the event of cardiac or respiratory ARREST Do not perform Intubation, CPR, defibrillation or ACLS   In the event of cardiac or respiratory ARREST Use medication by any route, position, wound care, and other measures to relive pain and suffering. May use oxygen, suction and manual treatment of airway obstruction as needed for comfort.      10/05/21 1109           Code Status History     Date Active Date Inactive Code Status Order ID Comments User Context   10/04/2021 0154 10/05/2021 1109 Full Code [id number]  Lestine Mount, PA-C Inpatient       Prognosis:  Hours - Days  Discharge Planning: Anticipated Hospital Death  Care plan was discussed with primary RN, Dr. Tacy Learn, [facility], hospice liaison, patient, patient's sister  Thank you for allowing the Palliative Medicine Team to assist in the care of this patient.   Total Time 90 minutes Prolonged Time Billed  yes       Greater than 50%  of this time was spent counseling and coordinating care related  to the above assessment and plan.  Lin Landsman, NP  Please contact Palliative Medicine Team phone at [phone] for questions and concerns.

## 2021-10-06 NOTE — Progress Notes (Signed)
[facility] Progress Note Patient Name: Colin Allen. DOB: 06/24/1961 MRN: [id number]   Date of Service  10/06/2021  HPI/Events of Note  Estimated corrected calcium 7.7 gm  / dl  (ionized calcium result is pending).  eICU Interventions  Calcium gluconate 1 gm iv bolus ordered.        Kerry Kass Colin Allen 10/06/2021, 6:39 AM

## 2021-10-06 NOTE — Progress Notes (Addendum)
  Manufacturing engineer [facility]) Hospital Liaison note.    Addendum: Per discussion with Palliative Medicine Team pt is an anticipated hospital death.  [facility] will close this referral at [facility] time.  Please feel free to re-consult [facility] should the need arise.  Thank you.   Chart and pt information have been reviewed by [facility] physician.  Eligibility for [place] confirmed  [facility] is unable to offer a room today. Hospital Liaison will follow up tomorrow or sooner if a room becomes available. Please do not hesitate to call with questions.    Thank you for the opportunity to participate in this patient's care.  Domenic Moras, BSN, RN [facility] Liaison (listed on [place] under Hospice[facility])    [phone] (24h on call)

## 2021-10-07 DIAGNOSIS — A419 Sepsis, unspecified organism: Secondary | ICD-10-CM | POA: Diagnosis not present

## 2021-10-07 DIAGNOSIS — Z7189 Other specified counseling: Secondary | ICD-10-CM | POA: Diagnosis not present

## 2021-10-07 DIAGNOSIS — R4182 Altered mental status, unspecified: Secondary | ICD-10-CM | POA: Diagnosis not present

## 2021-10-07 DIAGNOSIS — N179 Acute kidney failure, unspecified: Secondary | ICD-10-CM | POA: Diagnosis not present

## 2021-10-07 DIAGNOSIS — R6521 Severe sepsis with septic shock: Secondary | ICD-10-CM | POA: Diagnosis not present

## 2021-10-07 DIAGNOSIS — Z515 Encounter for palliative care: Secondary | ICD-10-CM | POA: Diagnosis not present

## 2021-10-07 MED ORDER — FENTANYL CITRATE (PF) 100 MCG/2ML IJ SOLN
50.0000 ug | INTRAMUSCULAR | Status: DC | PRN
  Administered 2021-10-07: 50 ug via INTRAVENOUS
  Administered 2021-10-07: 100 ug via INTRAVENOUS
  Administered 2021-10-08 (×2): 50 ug via INTRAVENOUS
  Administered 2021-10-08: 100 ug via INTRAVENOUS
  Filled 2021-10-07 (×4): qty 2

## 2021-10-07 MED ORDER — GLYCOPYRROLATE 0.2 MG/ML IJ SOLN: 0.2000 mg | INTRAMUSCULAR | Status: DC | PRN

## 2021-10-07 MED ORDER — POLYVINYL ALCOHOL 1.4 % OP SOLN
1.0000 [drp] | Freq: Four times a day (QID) | OPHTHALMIC | Status: DC | PRN
  Filled 2021-10-07: qty 15

## 2021-10-07 MED ORDER — HALOPERIDOL LACTATE 5 MG/ML IJ SOLN: 0.5000 mg | INTRAMUSCULAR | Status: DC | PRN

## 2021-10-07 MED ORDER — ACETAMINOPHEN 650 MG RE SUPP: 650.0000 mg | Freq: Four times a day (QID) | RECTAL | Status: DC | PRN

## 2021-10-07 MED ORDER — HALOPERIDOL LACTATE 2 MG/ML PO CONC
0.5000 mg | ORAL | Status: DC | PRN
  Filled 2021-10-07: qty 0.3

## 2021-10-07 MED ORDER — LORAZEPAM 2 MG/ML IJ SOLN
1.0000 mg | INTRAMUSCULAR | Status: DC | PRN
  Administered 2021-10-11 (×3): 1 mg via INTRAVENOUS
  Filled 2021-10-07 (×2): qty 1

## 2021-10-07 MED ORDER — GLYCOPYRROLATE 1 MG PO TABS: 1.0000 mg | ORAL_TABLET | ORAL | Status: DC | PRN

## 2021-10-07 MED ORDER — ONDANSETRON 4 MG PO TBDP: 4.0000 mg | ORAL_TABLET | Freq: Four times a day (QID) | ORAL | Status: DC | PRN

## 2021-10-07 MED ORDER — ALBUTEROL SULFATE (2.5 MG/3ML) 0.083% IN NEBU: 2.5000 mg | INHALATION_SOLUTION | RESPIRATORY_TRACT | Status: DC | PRN

## 2021-10-07 MED ORDER — ACETAMINOPHEN 325 MG PO TABS: 650.0000 mg | ORAL_TABLET | Freq: Four times a day (QID) | ORAL | Status: DC | PRN

## 2021-10-07 MED ORDER — MOMETASONE FURO-FORMOTEROL FUM 100-5 MCG/ACT IN AERO
2.0000 | INHALATION_SPRAY | Freq: Two times a day (BID) | RESPIRATORY_TRACT | Status: DC
  Filled 2021-10-07: qty 8.8

## 2021-10-07 MED ORDER — HALOPERIDOL 0.5 MG PO TABS: 0.5000 mg | ORAL_TABLET | ORAL | Status: DC | PRN

## 2021-10-07 MED ORDER — MOMETASONE FURO-FORMOTEROL FUM 200-5 MCG/ACT IN AERO
2.0000 | INHALATION_SPRAY | Freq: Two times a day (BID) | RESPIRATORY_TRACT | Status: DC
  Administered 2021-10-07 – 2021-10-11 (×10): 2 via RESPIRATORY_TRACT
  Filled 2021-10-07: qty 8.8

## 2021-10-07 MED ORDER — ONDANSETRON HCL 4 MG/2ML IJ SOLN
4.0000 mg | Freq: Four times a day (QID) | INTRAMUSCULAR | Status: DC | PRN
  Administered 2021-10-08: 4 mg via INTRAVENOUS
  Filled 2021-10-07: qty 2

## 2021-10-07 MED ORDER — BIOTENE DRY MOUTH MT LIQD
15.0000 mL | Freq: Two times a day (BID) | OROMUCOSAL | Status: DC
  Administered 2021-10-08 – 2021-10-11 (×6): 15 mL via TOPICAL

## 2021-10-07 NOTE — Progress Notes (Signed)
NAME:  Colin Allen, Colin Allen MRN:  [id number], DOB:  02-03-1961, LOS: 3 ADMISSION DATE:  10/04/2021 CONSULTATION DATE:  10/04/2021 REFERRING MD:  Cinda Quest - [facility] CHIEF COMPLAINT:  AMS, Sepsis  History of Present Illness:  60 year old man who presented to [facility] via EMS 11/9 for AMS, weakness, possible sepsis. Found down post-fall after family had not heard from him x 3 days. Jaundiced and hypotensive on EMS arrival. 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  On ED arrival, vitals notable for HR 77, BP 67/45, afebrile. Labs were notable for WBC 28.5, stable H&H, normal Plt (190). INR elevated 1.5. Na 133, K 4.9, CO2 19, Cr 3.77 (baseline 1.2 2021). Transaminases elevated to 561/177, Tbili 29.6. Ammonia 51, LA 1.8. Trop 74 (69) and BNP 88.7. CK 1913 with concern for rhabdomyolysis. CT Head NAICA. CT A/P notable for irregular 3.3cm pancreatic tail mass c/f neoplasm as well as "innumerable" metastases measuring up to 4.4cm.  Patient was transferred to [facility] 11/9 for further evaluation and management.  On arrival to [facility], patient assessed at bedside. States that he initially fell Friday, 11/4 while carrying groceries but was able to get up. He states that he fell again Sunday, 11/6 but is unclear on how this fall occurred and does not seem to remember being unable to get up; reports that he came to the hospital on his own without EMS assistance. Did not feel dizzy or lightheaded prior to falls and states these were mechanical. Denies fevers or recent illness at home. Occasional SOB for which he utilizes 2L O2 and/or rescue inhaler, daily Symbicort. Denies nausea/vomiting, changes in bowel habits/blood in stool, LE or abdominal swelling. Endorses jaundice at baseline. Patient states that he sees his PCP every 3 months. Has lost around 70lb in a year. Tries to remain on top of his diet (previously with high cholesterol). Reports checking his blood  glucoses TID with meals as well as frequent BPs, but he cannot say what his normal BPs are.  Pertinent Medical History:   Past Medical History:  Diagnosis Date   Back pain    Cirrhosis of liver ([place]) 2010   Colon polyps 02/2014   found 3 polyps   Diabetes mellitus without complication ([place])    Emphysema of lung ([place])    Fractured elbow 1993   right elbow   Hyperlipidemia    Hypertension    Sleep apnea    Thyroid disease    Significant Hospital Events: Including procedures, antibiotic start and stop dates in addition to other pertinent events   11/8 - Presented to [facility] via EMS for AMS after being found down, unknown length of time. C/f rhabdo with elevated CK/AKI. Also with transaminitis + hyperbilirubinemia. CT A/P with new pancreatic tail mass + mets. Started cefepime, and vanc (got one dose flagyl)  11/9 - Transferred to [facility] for further management.Arrived from [place] hypotensive with SBPs 90s Multiple significant lab abnormalities including LFTs, CK/Cr CT A/P with new pancreatic tail mass + ?mets 11/10 making urine. Bicarb gtt placed on hold as Ca dropped. Still on pressors. Renal Fxn worse.   Interim History / Subjective:  Transitioned to comfort care yesterday. He complains of headache, abdominal pain.  Objective:  Blood pressure 106/73, pulse 84, temperature 98.7 F (37.1 C), temperature source Oral, resp. rate 20, weight 100.1 kg, SpO2 96 %.        Intake/Output Summary (Last 24 hours) at 10/07/2021 1053 Last data  filed at 10/07/2021 0800 Gross per 24 hour  Intake 2256.28 ml  Output 1700 ml  Net 556.28 ml    Filed Weights   10/05/21 0415 10/06/21 0448 10/07/21 0500  Weight: 102.2 kg 104 kg 100.1 kg   Physical Examination: General chronically ill appearing man sitting up in bed in NAD HENT: [place]/AT, eyes icteric Pulm: breathing comfortably on [place], basilar rhales. No wheezing. 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   Best Practice: (right click and "Reselect all SmartList Selections" daily)   Diet/type: Regular consistency (see orders) - 2G Na DVT prophylaxis: other - held in the setting of elevated INR GI prophylaxis: PPI Lines: N/A Foley:  N/A Code Status:  full code Last date of multidisciplinary goals of care discussion [ comfort care]  This patient is critically ill with multiple organ system failure which requires frequent high complexity decision making, assessment, support, evaluation, and titration of therapies. This was completed through the application of advanced monitoring technologies and extensive interpretation of multiple databases. During this encounter critical care time was devoted to patient care services described in this note for 35 minutes.  Julian Hy, DO 10/07/21 12:35 PM [facility]

## 2021-10-07 NOTE — Progress Notes (Addendum)
Daily Progress Note   Patient Name: Colin Allen.       Date: 10/07/2021 DOB: 22-Feb-1961  Age: 60 y.o. MRN#: [id number] Attending Physician: Julian Hy, DO Primary Care Physician: Marden Noble, MD Admit Date: 10/04/2021  Reason for Consultation/Follow-up: Establishing goals of care, "Establishing goals of care, "AKI, longterm cirrhotic, pancreatic mass with extensive liver mets, encephalopathic" and "GOC, new pancreatic mass + ETOH Cirrhosis"  Subjective: Chart review performed. Received report from primary RN - no acute concerns. Levo is now up to 64mg. Notary is planned to be at patient's room to assist with documents at 4:00pm today. Greatly appreciate RN's assistance in helping family find notary.   1:00 PM Went to visit patient at bedside - mother and niece were present. Patient was lying in bed awake, alert, oriented, and able to participate in conversation; however, at times does not seem to comprehend complex medical discussions. No signs or non-verbal gestures of pain or discomfort noted. No respiratory distress, increased work of breathing, or secretions noted. Patient denies pain at this time.  Therapeutic listening and emotional support provided to patient and family. Plan is for transition to full comfort after documents are notarized. Will return at 4:00pm.  4:05 PM  Arrived to bedside - waiting for sister/Wendy to arrive so documents can be notarized.   Met with patient, sister/Wendy, and their mother after documents were notarized. Reviewed plan for transition to full comfort measures. We talked about transition to comfort measures in house and what that would entail inclusive of medications to control pain, dyspnea, agitation, nausea, and itching. We discussed  stopping all unnecessary measures such as blood draws, needle sticks, oxygen, antibiotics, CBGs/insulin, cardiac monitoring, IVF, levophed, and frequent vital signs. At first, patient was hesitant, stating he "wants to fight this." Reviewed again current medical situation. Reviewed symptom management in context of full scope vs full comfort. Patient agrees he would rather focus on full comfort rather than prolonging life. Discussed stopping levo - patient originally requested to wait to stop until tomorrow morning. After discussion with family, he would like to wean levo tonight. Discussed tapering by half every 30 minutes to 1 hour - he was agreeable. He would like to speak with chaplain first, have a bath, receive pain medication to "sleep so he doesn't wake up," then  begin weaning. Patient and family are aware that once weaning of levo starts, he will likely decline quickly.   Reviewed plan for transition to comfort / weaning levo with primary RN per patient's request above. RN called chaplain.   Comfort orders placed.  6:30 PM  Received page from [facility] - states patient was requesting a different plan for weaning off levophed as outlined above and goals now seem unclear.   Went back to patient's bedside - he states he worried and does not want to wean off levophed as quickly as discussed - he would like to wean slower. Discussed different options - he was agreeable to wean 30mg every hour. He again states, "I want to go to sleep and not wake up." Reviewed that we would ensure his comfort and keep him free from suffering through this process. Patient's mother, sister, niece, and a male visitor are now all at bedside. They are clear we will wean levo tonight by 28m every hour per his request.   Reviewed updated plan with primary RN. Updated orders to reflect change.  All questions and concerns addressed. Encouraged to call with questions and/or concerns. PMT card previously provided.   Length of Stay:  3  Current Medications: Scheduled Meds:  . calcitRIOL  0.25 mcg Oral Daily  . Chlorhexidine Gluconate Cloth  6 each Topical Daily  . lactulose  30 g Oral BID  . mometasone-formoterol  2 puff Inhalation BID  . polyethylene glycol  17 g Oral Daily    Continuous Infusions: . sodium chloride 10 mL/hr at 10/06/21 1102  . norepinephrine (LEVOPHED) Adult infusion 13 mcg/min (10/07/21 0700)    PRN Meds: albuterol, fentaNYL (SUBLIMAZE) injection, lip balm, oxyCODONE, white petrolatum  Physical Exam Vitals and nursing note reviewed.  Constitutional:      General: He is not in acute distress.    Appearance: He is ill-appearing.  Pulmonary:     Effort: No respiratory distress.  Skin:    General: Skin is warm and dry.     Coloration: Skin is jaundiced.  Neurological:     Mental Status: He is alert and oriented to person, place, and time.     Motor: Weakness present.  Psychiatric:        Attention and Perception: Attention normal.        Behavior: Behavior is cooperative.        Cognition and Memory: Cognition and memory normal.            Vital Signs: BP 106/73   Pulse 84   Temp 98.7 F (37.1 C) (Oral)   Resp 20   Wt 100.1 kg   SpO2 96%   BMI 29.93 kg/m  SpO2: SpO2: 96 % O2 Device: O2 Device: Room Air O2 Flow Rate:    Intake/output summary:  Intake/Output Summary (Last 24 hours) at 10/07/2021 1033 Last data filed at 10/07/2021 0800 Gross per 24 hour  Intake 2256.28 ml  Output 1700 ml  Net 556.28 ml   LBM:   Baseline Weight: Weight: 97.5 kg Most recent weight: Weight: 100.1 kg       Palliative Assessment/Data: PPS 20%    Flowsheet Rows    Flowsheet Row Most Recent Value  Intake Tab   Referral Department Hospitalist  Unit at Time of Referral ICU  Palliative Care Primary Diagnosis Cancer  Date Notified 10/05/21  Palliative Care Type New Palliative care  Reason for referral Clarify Goals of Care  Date of Admission 10/04/21  Date first seen by  Palliative  Care 10/05/21  # of days Palliative referral response time 0 Day(s)  # of days IP prior to Palliative referral 1  Clinical Assessment   Psychosocial & Spiritual Assessment   Palliative Care Outcomes   Patient/Family meeting held? Yes  Who was at the meeting? patient, sister  Palliative Care Outcomes Clarified goals of care, Counseled regarding hospice, Provided advance care planning, Provided psychosocial or spiritual support, Transitioned to hospice       Patient Active Problem List   Diagnosis Date Noted  . Altered mental status 10/04/2021  . Muscle pain 07/30/2014    Palliative Care Assessment & Plan   Patient Profile: 60 y.o. 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 Patient was found down post-fall after family had not heard from him x 3 days and called EMS. 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   ED Course: On ED arrival, vitals notable for HR 77, BP 67/45, afebrile. Labs were notable for WBC 28.5, stable H&H, normal Plt (190). INR elevated 1.5. Na 133, K 4.9, CO2 19, Cr 3.77 (baseline 1.2 2021). Transaminases elevated to 561/177, Tbili 29.6. Ammonia 51, LA 1.8. Trop 74 (69) and BNP 88.7. CK 1913 with concern for rhabdomyolysis. CT Head NAICA. CT A/P notable for irregular 3.3cm pancreatic tail mass c/f neoplasm as well as "innumerable" metastases measuring up to 4.4cm.  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 Acute metabolic acidosis Concern about end of  life  Recommendations/Plan: Initiated full comfort measures Continue DNR/DNI as previously documented Once weaning levophed starts, anticipate quick decline - anticipate hospital death Patient requests to see chaplain, have a bath, receive pain medication, then begin weaning levo Patient requests levo to be weaned by 51mg about every hour   Chaplain consult placed - RN called to notify  Added orders for EOL symptom management and to reflect full comfort measures, as well as discontinued orders that were not focused on comfort Unrestricted visitation orders were placed per current [facility] visitation policy  Nursing to provide frequent assessments and administer PRN medications as clinically necessary to ensure EOL comfort PMT will continue to follow and support holistically   Goals of Care and Additional Recommendations: Limitations on Scope of Treatment: Full Comfort Care  Code Status:    Code Status Orders  (From admission, onward)           Start     Ordered   10/05/21 1109  Do not attempt resuscitation (DNR)  Continuous       Question Answer Comment  In the event of cardiac or respiratory ARREST Do not call a "code blue"   In the event of cardiac or respiratory ARREST Do not perform Intubation, CPR, defibrillation or ACLS   In the event of cardiac or respiratory ARREST Use medication by any route, position, wound care, and other measures to relive pain and suffering. May use oxygen, suction and manual treatment of airway obstruction as needed for comfort.      10/05/21 1109           Code Status History  Date Active Date Inactive Code Status Order ID Comments User Context   10/04/2021 0154 10/05/2021 1109 Full Code [id number]  Lestine Mount, PA-C Inpatient       Prognosis:  Likely <24 hours  Discharge Planning: Anticipated Hospital Death  Care plan was discussed with primary RN, patient's family, patient  Thank you for allowing the  Palliative Medicine Team to assist in the care of this patient.   Total Time 108 minutes Prolonged Time Billed  yes       Greater than 50%  of this time was spent counseling and coordinating care related to the above assessment and plan.  Lin Landsman, NP  Please contact Palliative Medicine Team phone at [phone] for questions and concerns.

## 2021-10-07 NOTE — Progress Notes (Signed)
[facility] Progress Note  Name: Colin Allen. MRN: [id number] DOB: [birth year]  Chief Complaint:  Found down at home   Subjective:  Per charted they elected to transition to comfort measures after his mother visited him.  He had 2.1 liters UOP over 11/11.  They haven't made the full transition to comfort but they plan to and he isn't getting lab draws per nursing.  Levo is up to 13 mcg/min.   Review of systems:   Denies n/v Denies overt shortness of breath - states he has a slight wheeze sometimes  -------- Background on referral:   Gabreal Worton. is a 60 y.o. male who has a PMHx of EtOH abuse with cirrhosis, T2DM, HTN, HLD, who presented to the ED for AMS and found down at home now admitted for acute hepatic encephalopathy and concern for septic shock. 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 Nephrology has been consulted for management of AKI and acidosis      Intake/Output Summary (Last 24 hours) at 10/07/2021 0622 Last data filed at 10/07/2021 0547 Gross per 24 hour  Intake 1970.31 ml  Output 2050 ml  Net -79.69 ml    Vitals:  Vitals:   10/07/21 0415 10/07/21 0430 10/07/21 0445 10/07/21 0500  BP: 106/73 106/78 119/79 106/69  Pulse: 86 83 84 86  Resp: 19 19 20  (!) 22  Temp:      TempSrc:      SpO2: 96% 95% 96% 97%  Weight:    100.1 kg     Physical Exam:   General adult male in bed in no acute distress HEENT normocephalic atraumatic extraocular movements intact; icteric sclera  Neck supple trachea midline Lungs clear to auscultation bilaterally normal work of breathing at rest on room air Heart S1S2 no rub Abdomen soft nontender obese body habitus; ascites Extremities trace to 1+ edema lower extremities Psych - calm mood and affect Neuro - awakens with conversation with his sister at bedside; he is somewhat conversant - some word finding difficulty Skin - jaundice GU no foley; condom  catheter  Medications reviewed   Labs:  BMP Latest Ref Rng & Units 10/06/2021 10/05/2021 10/05/2021  Glucose 70 - 99 mg/dL 123(H) - -  BUN 6 - 20 mg/dL 85(H) - -  Creatinine 0.61 - 1.24 mg/dL 5.34(H) 5.41(H) -  Sodium 135 - 145 mmol/L 131(L) - -  Potassium 3.5 - 5.1 mmol/L 3.4(L) - -  Chloride 98 - 111 mmol/L 95(L) - -  CO2 22 - 32 mmol/L 17(L) - -  Calcium 8.9 - 10.3 mg/dL 5.7(LL) - 6.1(LL)     Assessment/Plan:   # AKI - Secondary to ATN.  He is normally on multiple antihypertensives and was hypotensive when found by EMS.  One agent is an ACE inhibitor - No escalation in care and transitioning to comfort measures soon.  This is in the setting of decompensated cirrhosis and new metastatic malignancy     # Metabolic acidosis - on last assessment paused bicarb gtt again - severe hypocalcemia    # Septic shock -On Levophed  - Antibiotics per primary team discretion   # Encephalopathy - metabolic  - multifactorial with AKI and cirrhosis    # Pancreatic mass - per primary team    # Cirrhosis - decompensated  - longstanding EtOH abuse per his sister - on lactulose per critical care team    # Alcohol abuse  -Team is monitoring for  withdrawal   # Hyperphosphatemia - can offer binder with meals for now as aligns with goals - renal diet  - improving a bit   # Hypocalcemia - repleted 2 gram  - paused bicarb - intact PTH 176  - on empiric calcitriol at 0.25 mcg daily - continue PO meds as aligns with goals of care  # Normocytic anemia  - no acute indication for [facility]  Nephrology will sign off.  Appreciate critical care team/unit staff.  He is transitioning to comfort care and no further lab draws are planned per nursing.  Please do not hesitate to contact me if I can be of any assistance with his care   Claudia Desanctis, MD 10/07/2021 6:39 AM

## 2021-10-07 NOTE — Progress Notes (Signed)
   10/07/21 1737  Clinical Encounter Type  Visited With Family;Patient  Visit Type Initial;Critical Care;Spiritual support  Referral From Nurse  Consult/Referral To Chaplain   Chaplain responded to page for Pt transitioning to comfort care. Pt was alert and oriented. Chaplain and Pt spoke about Pt's spiritual journey and forgiveness. Chaplain prayed with Pt and affirmed Pt's convictions and feelings. Chaplain engaged active listening and provided encouragement around the transition ahead. Pt hugged chaplain before Chaplain departed and appeared to be more at peace. Chaplain checked in with Pt's family in the consult room and prayed with them as well. Chaplain remains available.  This note was prepared by Marijo File, MDiv. Chaplain remains available as needed through the on-call pager: [phone].

## 2021-10-08 ENCOUNTER — Encounter (HOSPITAL_COMMUNITY): Payer: Self-pay | Admitting: Pulmonary Disease

## 2021-10-08 DIAGNOSIS — Z7189 Other specified counseling: Secondary | ICD-10-CM | POA: Diagnosis not present

## 2021-10-08 DIAGNOSIS — R52 Pain, unspecified: Secondary | ICD-10-CM

## 2021-10-08 DIAGNOSIS — A419 Sepsis, unspecified organism: Secondary | ICD-10-CM | POA: Diagnosis not present

## 2021-10-08 DIAGNOSIS — R6521 Severe sepsis with septic shock: Secondary | ICD-10-CM | POA: Diagnosis not present

## 2021-10-08 DIAGNOSIS — N179 Acute kidney failure, unspecified: Secondary | ICD-10-CM | POA: Diagnosis not present

## 2021-10-08 DIAGNOSIS — R4182 Altered mental status, unspecified: Secondary | ICD-10-CM | POA: Diagnosis not present

## 2021-10-08 DIAGNOSIS — L299 Pruritus, unspecified: Secondary | ICD-10-CM

## 2021-10-08 DIAGNOSIS — Z515 Encounter for palliative care: Secondary | ICD-10-CM | POA: Diagnosis not present

## 2021-10-08 MED ORDER — FENTANYL CITRATE (PF) 100 MCG/2ML IJ SOLN: 50.0000 ug | INTRAMUSCULAR | Status: DC | PRN

## 2021-10-08 MED ORDER — DIPHENHYDRAMINE HCL 50 MG/ML IJ SOLN
50.0000 mg | Freq: Four times a day (QID) | INTRAMUSCULAR | Status: DC
  Administered 2021-10-08 – 2021-10-11 (×12): 50 mg via INTRAVENOUS
  Filled 2021-10-08 (×12): qty 1

## 2021-10-08 MED ORDER — DIPHENHYDRAMINE-ZINC ACETATE 2-0.1 % EX CREA
TOPICAL_CREAM | CUTANEOUS | Status: DC | PRN
  Filled 2021-10-08: qty 28

## 2021-10-08 MED ORDER — DIPHENHYDRAMINE HCL 50 MG/ML IJ SOLN
50.0000 mg | Freq: Three times a day (TID) | INTRAMUSCULAR | Status: DC | PRN
  Administered 2021-10-08 (×2): 25 mg via INTRAVENOUS
  Administered 2021-10-10: 50 mg via INTRAVENOUS
  Filled 2021-10-08 (×2): qty 1

## 2021-10-08 MED ORDER — GLYCOPYRROLATE 0.2 MG/ML IJ SOLN: 0.2000 mg | INTRAMUSCULAR | Status: DC | PRN

## 2021-10-08 MED ORDER — HALOPERIDOL LACTATE 2 MG/ML PO CONC
2.0000 mg | Freq: Four times a day (QID) | ORAL | Status: DC | PRN
  Filled 2021-10-08: qty 1

## 2021-10-08 MED ORDER — FENTANYL BOLUS VIA INFUSION
100.0000 ug | INTRAVENOUS | Status: DC | PRN
  Filled 2021-10-08: qty 100

## 2021-10-08 MED ORDER — HALOPERIDOL 1 MG PO TABS
2.0000 mg | ORAL_TABLET | Freq: Four times a day (QID) | ORAL | Status: DC | PRN
  Filled 2021-10-08: qty 2

## 2021-10-08 MED ORDER — HALOPERIDOL LACTATE 5 MG/ML IJ SOLN: 2.0000 mg | Freq: Four times a day (QID) | INTRAMUSCULAR | Status: DC | PRN

## 2021-10-08 MED ORDER — DEXAMETHASONE SODIUM PHOSPHATE 10 MG/ML IJ SOLN
10.0000 mg | Freq: Once | INTRAMUSCULAR | Status: AC
  Administered 2021-10-08: 10 mg via INTRAVENOUS
  Filled 2021-10-08: qty 1

## 2021-10-08 MED ORDER — FENTANYL BOLUS VIA INFUSION
50.0000 ug | INTRAVENOUS | Status: DC | PRN
  Administered 2021-10-08: 100 ug via INTRAVENOUS
  Administered 2021-10-10 (×2): 50 ug via INTRAVENOUS
  Administered 2021-10-11: 100 ug via INTRAVENOUS
  Administered 2021-10-11 (×3): 50 ug via INTRAVENOUS
  Administered 2021-10-11: 100 ug via INTRAVENOUS
  Filled 2021-10-08 (×2): qty 100

## 2021-10-08 MED ORDER — FENTANYL 2500MCG IN NS 250ML (10MCG/ML) PREMIX INFUSION
0.0000 ug/h | INTRAVENOUS | Status: DC
Start: 2021-10-08 — End: 2021-10-11
  Administered 2021-10-08: 200 ug/h via INTRAVENOUS
  Administered 2021-10-08: 50 ug/h via INTRAVENOUS
  Administered 2021-10-09: 200 ug/h via INTRAVENOUS
  Administered 2021-10-10: 50 ug/h via INTRAVENOUS
  Administered 2021-10-10: 200 ug/h via INTRAVENOUS
  Filled 2021-10-08 (×6): qty 250

## 2021-10-08 MED ORDER — GLYCOPYRROLATE 1 MG PO TABS
1.0000 mg | ORAL_TABLET | ORAL | Status: DC | PRN
  Filled 2021-10-08: qty 1

## 2021-10-08 NOTE — Progress Notes (Signed)
[facility] Progress Note Patient Name: Colin Allen. DOB: September 13, 1961 MRN: [id number]   Date of Service  10/08/2021  HPI/Events of Note  Notified of patient complaining of itching Noted comfort care status  eICU Interventions  Benadryl 50 mg IV ordered prn     Intervention Category Minor Interventions: Routine modifications to care plan (e.g. PRN medications for pain, fever)  Raquel Sarna T Alcie Runions 10/08/2021, 2:20 AM

## 2021-10-08 NOTE — Progress Notes (Signed)
NAME:  Colin Allen, Colin Allen MRN:  [id number], DOB:  Aug 12, 1961, LOS: 4 ADMISSION DATE:  10/04/2021 CONSULTATION DATE:  10/04/2021 REFERRING MD:  Cinda Quest - [facility] CHIEF COMPLAINT:  AMS, Sepsis  History of Present Illness:  60 year old man who presented to [facility] via EMS 11/9 for AMS, weakness, possible sepsis. Found down post-fall after family had not heard from him x 3 days. Jaundiced and hypotensive on EMS arrival. 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  On ED arrival, vitals notable for HR 77, BP 67/45, afebrile. Labs were notable for WBC 28.5, stable H&H, normal Plt (190). INR elevated 1.5. Na 133, K 4.9, CO2 19, Cr 3.77 (baseline 1.2 2021). Transaminases elevated to 561/177, Tbili 29.6. Ammonia 51, LA 1.8. Trop 74 (69) and BNP 88.7. CK 1913 with concern for rhabdomyolysis. CT Head NAICA. CT A/P notable for irregular 3.3cm pancreatic tail mass c/f neoplasm as well as "innumerable" metastases measuring up to 4.4cm.  Patient was transferred to [facility] 11/9 for further evaluation and management.  On arrival to [facility], patient assessed at bedside. States that he initially fell Friday, 11/4 while carrying groceries but was able to get up. He states that he fell again Sunday, 11/6 but is unclear on how this fall occurred and does not seem to remember being unable to get up; reports that he came to the hospital on his own without EMS assistance. Did not feel dizzy or lightheaded prior to falls and states these were mechanical. Denies fevers or recent illness at home. Occasional SOB for which he utilizes 2L O2 and/or rescue inhaler, daily Symbicort. Denies nausea/vomiting, changes in bowel habits/blood in stool, LE or abdominal swelling. Endorses jaundice at baseline. Patient states that he sees his PCP every 3 months. Has lost around 70lb in a year. Tries to remain on top of his diet (previously with high cholesterol). Reports checking his blood  glucoses TID with meals as well as frequent BPs, but he cannot say what his normal BPs are.  Pertinent Medical History:   Past Medical History:  Diagnosis Date   Back pain    Cirrhosis of liver ([place]) 2010   Colon polyps 02/2014   found 3 polyps   Diabetes mellitus without complication ([place])    Emphysema of lung ([place])    Fractured elbow 1993   right elbow   Hyperlipidemia    Hypertension    Sleep apnea    Thyroid disease    Significant Hospital Events: Including procedures, antibiotic start and stop dates in addition to other pertinent events   11/8 - Presented to [facility] via EMS for AMS after being found down, unknown length of time. C/f rhabdo with elevated CK/AKI. Also with transaminitis + hyperbilirubinemia. CT A/P with new pancreatic tail mass + mets. Started cefepime, and vanc (got one dose flagyl)  11/9 - Transferred to [facility] for further management.Arrived from [place] hypotensive with SBPs 90s Multiple significant lab abnormalities including LFTs, CK/Cr CT A/P with new pancreatic tail mass + ?mets 11/10 making urine. Bicarb gtt placed on hold as Ca dropped. Still on pressors. Renal Fxn worse.   Interim History / Subjective:  Weaning off NE. Was able to get necessary documents signed with notary yesterday.Complains of hip and abdominal pain.  Objective:  Blood pressure 90/60, pulse 84, temperature 98.7 F (37.1 C), temperature source Oral, resp. rate 19, weight 100.1 kg, SpO2 90 %.        Intake/Output Summary (Last  24 hours) at 10/08/2021 0926 Last data filed at 10/08/2021 0600 Gross per 24 hour  Intake 1433.88 ml  Output 350 ml  Net 1083.88 ml    Filed Weights   10/05/21 0415 10/06/21 0448 10/07/21 0500  Weight: 102.2 kg 104 kg 100.1 kg   Physical Examination: General: chronically ill appearing man lying in bed in NAD, sleeping comfortably HENT: [place]/AT, eyes anicteric Pulm: breathing comfortably on [place], mild tachypnea, mild bilateral wheezing Card: S1S2,  RRR Abd : distended, soft Neuro arouses easily, moving all extremities, speech slightly less clear than yesterday Skin: jaundice, spider angiomas, no rashes Extremities: +BL ankle edema, no cyanosis  Resolved Hospital Problem List:    Assessment & Plan:  Acute metabolic and hepatic encephalopathy s/p fall at home with prolonged downtime  Septic shock; ongoing pressor requirements> weaning off Severe AKI with likely rhabdomyolysis Decompensated EtOH cirrhosis Hyperbilirubinemia 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  -Ok to stop lactulose -d/c norepi today -con't dulera BID, albuterol PRN -con't comfort care> orders placed  Sister and mother at bedside during rounds.  Best Practice: (right click and "Reselect all SmartList Selections" daily)   Diet/type: Regular consistency (see orders) - 2G Na DVT prophylaxis: other - held in the setting of elevated INR GI prophylaxis: PPI Lines: N/A Foley:  N/A Code Status:  full code Last date of multidisciplinary goals of care discussion [ comfort care]  This patient is critically ill with multiple organ system failure which requires frequent high complexity decision making, assessment, support, evaluation, and titration of therapies. This was completed through the application of advanced monitoring technologies and extensive interpretation of multiple databases. During this encounter critical care time was devoted to patient care services described in this note for 32 minutes.  Julian Hy, DO 10/08/21 9:31 AM [facility]

## 2021-10-08 NOTE — Progress Notes (Addendum)
Daily Progress Note   Patient Name: Colin Allen.       Date: 10/08/2021 DOB: [birth year]  Age: 60 y.o. MRN#: [id number] Attending Physician: Julian Hy, DO Primary Care Physician: Marden Noble, MD Admit Date: 10/04/2021  Reason for Consultation/Follow-up: Establishing goals of care, "Establishing goals of care, "AKI, longterm cirrhotic, pancreatic mass with extensive liver mets, encephalopathic" and "GOC, new pancreatic mass + ETOH Cirrhosis"  Subjective: Chart review performed. Received report from primary RN - no acute concerns. Levophed has been weaned to off. Fentanyl drip has been started.  Went to visit patient at bedside - sister and mother are present. Patient is lying in bed asleep - he briefly wakes to voice/gentle touch. During visit patient was moaning, restless, and scratching. No respiratory distress, increased work of breathing, or secretions noted.   Emotional support provided to family. Education provided to family on itching in context of cirrhosis per their request. Natural disease trajectory for EOL reviewed. Discussed symptom management plan with family and patient (for what he was awake for), to include increasing basal rate and providing bolus dose of fentanyl now, as well as scheduling benadryl - patient states "that's what I want" (referring to have pain better managed despite medications making him more sleepy) and family were also agreeable to plan. 33mcg bolus dose provided and increased basal rate to 73mcg/hr.   Reviewed with family plan for transfer out of ICU.  All questions and concerns addressed. Encouraged to call with questions and/or concerns. PMT card previously provided.  Reviewed symptom management plan with RN.   Length of Stay: 4  Current  Medications: Scheduled Meds:  . antiseptic oral rinse  15 mL Topical BID  . diphenhydrAMINE  50 mg Intravenous Q6H  . mometasone-formoterol  2 puff Inhalation BID    Continuous Infusions: . fentaNYL infusion INTRAVENOUS 75 mcg/hr (10/08/21 1048)    PRN Meds: acetaminophen **OR** acetaminophen, albuterol, diphenhydrAMINE, fentaNYL, glycopyrrolate **OR** glycopyrrolate **OR** glycopyrrolate, haloperidol **OR** haloperidol **OR** haloperidol lactate, lip balm, LORazepam, ondansetron **OR** ondansetron (ZOFRAN) IV, polyvinyl alcohol, white petrolatum  Physical Exam Vitals and nursing note reviewed.  Constitutional:      General: He is not in acute distress.    Appearance: He is ill-appearing.  Pulmonary:     Effort: No respiratory distress.  Skin:  General: Skin is warm and dry.     Coloration: Skin is jaundiced.  Neurological:     Mental Status: He is lethargic.     Motor: Weakness present.  Psychiatric:        Speech: Speech is slurred.        Behavior: Behavior is slowed. Behavior is cooperative.            Vital Signs: BP 90/60   Pulse 84   Temp 98.7 F (37.1 C) (Oral)   Resp (!) 22   Wt 100.1 kg   SpO2 90%   BMI 29.93 kg/m  SpO2: SpO2: 90 % O2 Device: O2 Device: Room Air O2 Flow Rate:    Intake/output summary:  Intake/Output Summary (Last 24 hours) at 10/08/2021 1059 Last data filed at 10/08/2021 1048 Gross per 24 hour  Intake 1481.69 ml  Output 350 ml  Net 1131.69 ml   LBM:   Baseline Weight: Weight: 97.5 kg Most recent weight: Weight: 100.1 kg       Palliative Assessment/Data: PPS 10-20%    Flowsheet Rows    Flowsheet Row Most Recent Value  Intake Tab   Referral Department Hospitalist  Unit at Time of Referral ICU  Palliative Care Primary Diagnosis Cancer  Date Notified 10/05/21  Palliative Care Type New Palliative care  Reason for referral Clarify Goals of Care  Date of Admission 10/04/21  Date first seen by Palliative Care 10/05/21  #  of days Palliative referral response time 0 Day(s)  # of days IP prior to Palliative referral 1  Clinical Assessment   Psychosocial & Spiritual Assessment   Palliative Care Outcomes   Patient/Family meeting held? Yes  Who was at the meeting? patient, sister  Palliative Care Outcomes Clarified goals of care, Counseled regarding hospice, Provided advance care planning, Provided psychosocial or spiritual support, Transitioned to hospice       Patient Active Problem List   Diagnosis Date Noted  . Altered mental status 10/04/2021  . Muscle pain 07/30/2014    Palliative Care Assessment & Plan   Patient Profile: 60 y.o. 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 Patient was found down post-fall after family had not heard from him x 3 days and called EMS. 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   ED Course: On ED arrival, vitals notable for HR 77, BP 67/45, afebrile. Labs were notable for WBC 28.5, stable H&H, normal Plt (190). INR elevated 1.5. Na 133, K 4.9, CO2 19, Cr 3.77 (baseline 1.2 2021). Transaminases elevated to 561/177, Tbili 29.6. Ammonia 51, LA 1.8. Trop 74 (69) and BNP 88.7. CK 1913 with concern for rhabdomyolysis. CT Head NAICA. CT A/P notable for irregular 3.3cm pancreatic tail mass c/f neoplasm as well as "innumerable" metastases measuring up to 4.4cm.  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 Itching is likely due to cirrhosis. Patient is not able to swallow pills at this time. Will add dexamethazone 10mg  IV once to see if that helps. Also, spoke with pharmacy regarding other options: will see if we stock cholestyramine ointment, if not will also add benadryl cream.    Goals of Care and Additional Recommendations: Limitations on Scope of Treatment: Full Comfort Care  Code Status:    Code Status Orders  (From admission, onward)           Start     Ordered   10/08/21 0844  DNR (Do not attempt resuscitation)  Continuous       Question Answer Comment  In the event of cardiac or respiratory ARREST Do not call a "code blue"   In the event of cardiac or respiratory ARREST Do not perform Intubation, CPR, defibrillation or ACLS   In the event of cardiac or respiratory ARREST Use medication by any route, position, wound care, and other measures to relive pain and suffering. May use oxygen, suction and manual treatment of airway obstruction as needed for comfort.      10/08/21 0843           Code Status History     Date Active Date Inactive Code Status Order ID Comments User Context   10/08/2021 0829 10/08/2021 0843 DNR [id number]  Julian Hy, DO Inpatient   10/07/2021 1749 10/08/2021 0829 DNR [id number]  Lin Landsman, NP Inpatient   10/05/2021 1109 10/07/2021 1749 DNR [id number]  Laurin Coder, MD Inpatient   10/04/2021  0154 10/05/2021 1109 Full Code [id number]  Lestine Mount, PA-C Inpatient       Prognosis:  Hours - Days  Discharge Planning: Anticipated Hospital Death  Care plan was discussed with primary RN, patient, patient's family  Thank you for allowing the Palliative Medicine Team to assist in the care of this patient.   Total Time 55 minutes Prolonged Time Billed  no       Greater than 50%  of this time was spent counseling and coordinating care related to the above assessment and plan.  Lin Landsman, NP  Please contact Palliative Medicine Team phone at [phone] for questions and concerns.

## 2021-10-09 DIAGNOSIS — G9341 Metabolic encephalopathy: Secondary | ICD-10-CM | POA: Diagnosis not present

## 2021-10-09 DIAGNOSIS — Z515 Encounter for palliative care: Secondary | ICD-10-CM | POA: Diagnosis not present

## 2021-10-09 DIAGNOSIS — R4182 Altered mental status, unspecified: Secondary | ICD-10-CM | POA: Diagnosis not present

## 2021-10-09 DIAGNOSIS — K7031 Alcoholic cirrhosis of liver with ascites: Secondary | ICD-10-CM | POA: Diagnosis not present

## 2021-10-09 DIAGNOSIS — K8689 Other specified diseases of pancreas: Secondary | ICD-10-CM | POA: Diagnosis not present

## 2021-10-09 DIAGNOSIS — N179 Acute kidney failure, unspecified: Secondary | ICD-10-CM | POA: Diagnosis not present

## 2021-10-09 LAB — CULTURE, BLOOD (ROUTINE X 2)
Culture: NO GROWTH
Culture: NO GROWTH

## 2021-10-09 MED ORDER — CAMPHOR-MENTHOL 0.5-0.5 % EX LOTN
TOPICAL_LOTION | CUTANEOUS | Status: DC | PRN
  Filled 2021-10-09: qty 222

## 2021-10-09 NOTE — Progress Notes (Signed)
NAME:  Shivan, Hodes MRN:  [id number], DOB:  06/10/1961, LOS: 5 ADMISSION DATE:  10/04/2021 CONSULTATION DATE:  10/04/2021 REFERRING MD:  Cinda Quest - [facility] CHIEF COMPLAINT:  AMS, Sepsis  History of Present Illness:  60 year old man who presented to [facility] via EMS 11/9 for AMS, weakness, possible sepsis. Found down post-fall after family had not heard from him x 3 days. Jaundiced and hypotensive on EMS arrival. 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  On ED arrival, vitals notable for HR 77, BP 67/45, afebrile. Labs were notable for WBC 28.5, stable H&H, normal Plt (190). INR elevated 1.5. Na 133, K 4.9, CO2 19, Cr 3.77 (baseline 1.2 2021). Transaminases elevated to 561/177, Tbili 29.6. Ammonia 51, LA 1.8. Trop 74 (69) and BNP 88.7. CK 1913 with concern for rhabdomyolysis. CT Head NAICA. CT A/P notable for irregular 3.3cm pancreatic tail mass c/f neoplasm as well as "innumerable" metastases measuring up to 4.4cm.  Patient was transferred to [facility] 11/9 for further evaluation and management and PCCM consulted for admission. Given new diagnosis of pancreatic mass with significant metastases, decompensated EtOH cirrhosis and new renal failure, PMT consulted for assistance with management and GOC. Ultimately, patient/family (sister, mother) opted for DNR status with transition to comfort measures only.  Pertinent Medical History:   Past Medical History:  Diagnosis Date   Back pain    Cirrhosis of liver ([place]) 2010   Colon polyps 02/2014   found 3 polyps   Diabetes mellitus without complication ([place])    Emphysema of lung ([place])    Fractured elbow 1993   right elbow   Hyperlipidemia    Hypertension    Sleep apnea    Thyroid disease    Significant Hospital Events: Including procedures, antibiotic start and stop dates in addition to other pertinent events   11/8 - Presented to [facility] via EMS for AMS after being found down, unknown  length of time. C/f rhabdo with elevated CK/AKI. Also with transaminitis + hyperbilirubinemia. CT A/P with new pancreatic tail mass + mets. Started cefepime, and vanc (got one dose flagyl)  11/9 - Transferred to [facility] for further management.Arrived from [facility]. Remains hypotensive with SBPs 90s Multiple significant lab abnormalities including LFTs, CK/Cr CT A/P with new pancreatic tail mass + ?mets 11/10 Making urine. Bicarb gtt placed on hold as Ca dropped. Still on pressors. Renal Fxn worse. DNR status. 11/13 Transition to Comfort Care. 11/14 Ongoing PMT assistance. Continues to itch, Benadryl scheduled, Dexamethasone x 1. Remains Comfort Care.  Interim History / Subjective:  Feeling ok this morning, "hanging in there" Generally in good spirits, sister at bedside Ongoing itching is his only major complaint, some hip discomfort (improved with repositioning) Poor appetite, eating as able Mild wheezing this AM, improved with inhaler Remains comfort care  Objective:  Blood pressure (!) 78/51, pulse 71, temperature 98.8 F (37.1 C), temperature source Oral, resp. rate (!) 7, height 6' (1.829 m), weight 100.1 kg, SpO2 91 %.        Intake/Output Summary (Last 24 hours) at 10/09/2021 0838 Last data filed at 10/09/2021 0700 Gross per 24 hour  Intake 443.8 ml  Output 500 ml  Net -56.2 ml    Filed Weights   10/06/21 0448 10/07/21 0500 10/08/21 0800  Weight: 104 kg 100.1 kg 100.1 kg   Physical Examination: General: Acute-on-chronically ill-appearing middle-aged man in NAD. HEENT: [place]/AT, +scleral icterus, PERRL, moist mucous membranes. Neuro: Awake, oriented x 4.  Responds to verbal stimuli. Following commands consistently. Moves all 4 extremities spontaneously. CV: RRR, no m/g/r. PULM: Breathing even and unlabored on RA. Lung fields CTAB. Sounds c/w upper airway secretions, clearing with cough. GI: Soft, nontender, mildly distended. Hypoactive bowel sounds. Extremities: Bilateral symmetric  2-3+ LE edema noted to calf Skin: Warm, very dry. Jaundice noted.  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  Best Practice: (right click and "Reselect all SmartList Selections" daily)   Diet/type: Regular consistency (see orders) - 2G Na DVT prophylaxis: other - held in the setting of elevated INR GI prophylaxis: PPI Lines: N/A Foley:  N/A Code Status:  full code Last date of multidisciplinary goals of care discussion [Comfort Care]  Critical care time: N/A   Rhae Lerner [facility] 10/09/21 8:40 AM  Please see [email or web address] for pager details.  From 7A-7P if no response, please call [phone] After hours, please call ELink [phone]

## 2021-10-09 NOTE — Progress Notes (Signed)
Nutrition Brief Note  Chart reviewed. Pt has transitioned to comfort care.  No nutrition interventions planned at this time.  Please consult as needed.    Kate Lylian Sanagustin, MS, RD, LDN Inpatient Clinical Dietitian Please see AMiON for contact information.  

## 2021-10-09 NOTE — Progress Notes (Signed)
This chaplain is present for EOL spiritual care.  The Pt. sister-Wendy is at the bedside.  The RN is responding to the Pt. request to move in the bed.  The Pt. is appreciative of the ongoing spiritual care and openly shares God's love and his belief in eternal life during the visit.   The chaplain prayed with the family as requested by the Pt.  The chaplain is available for F/U spiritual care as needed.  Chaplain Sallyanne Kuster [phone]

## 2021-10-09 NOTE — Progress Notes (Signed)
Daily Progress Note   Patient Name: Colin Allen.       Date: 10/09/2021 DOB: [birth year]  Age: 60 y.o. MRN#: [id number] Attending Physician: Jacky Kindle, MD Primary Care Physician: Marden Noble, MD Admit Date: 10/04/2021  Reason for Consultation/Follow-up: Establishing goals of care, "Establishing goals of care, "AKI, longterm cirrhotic, pancreatic mass with extensive liver mets, encephalopathic" and "GOC, new pancreatic mass + ETOH Cirrhosis"  Subjective: Patient appears comfortable. He is currently sleeping and I did not attempt to wake him. Fentanyl infusion is at 200 mcg/hr. No non-verbal signs of pain or discomfort noted. Respirations are even and unlabored. No excessive respiratory secretions noted.   His mother is at bedside but is currently sleeping.     Length of Stay: 5  Current Medications: Scheduled Meds:  . antiseptic oral rinse  15 mL Topical BID  . diphenhydrAMINE  50 mg Intravenous Q6H  . mometasone-formoterol  2 puff Inhalation BID    Continuous Infusions: . fentaNYL infusion INTRAVENOUS 200 mcg/hr (10/09/21 1215)    PRN Meds: acetaminophen **OR** acetaminophen, albuterol, camphor-menthol, diphenhydrAMINE, diphenhydrAMINE-zinc acetate, fentaNYL, glycopyrrolate **OR** glycopyrrolate **OR** glycopyrrolate, haloperidol **OR** haloperidol **OR** haloperidol lactate, lip balm, LORazepam, ondansetron **OR** ondansetron (ZOFRAN) IV, polyvinyl alcohol, white petrolatum  Physical Exam Vitals reviewed.  Constitutional:      General: He is sleeping. He is not in acute distress.    Appearance: He is ill-appearing.  Pulmonary:     Effort: Pulmonary effort is normal.  Skin:    Coloration: Skin is jaundiced.            Vital Signs: BP (!) 78/51 (BP Location:  Left Arm)   Pulse 71   Temp 98.8 F (37.1 C) (Oral)   Resp (!) 7   Ht 6' (1.829 m)   Wt 100.1 kg   SpO2 91%   BMI 29.93 kg/m  SpO2: SpO2: 91 % O2 Device: O2 Device: Room Air O2 Flow Rate:    Intake/output summary:  Intake/Output Summary (Last 24 hours) at 10/09/2021 1645 Last data filed at 10/09/2021 0700 Gross per 24 hour  Intake 299.63 ml  Output 500 ml  Net -200.37 ml   LBM: Last BM Date: 10/08/21 Baseline Weight: Weight: 97.5 kg Most recent weight: Weight: 100.1 kg       Palliative Assessment/Data: PPS 10-20%  Palliative Care Assessment & Plan   Patient Profile: 60 y.o. 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 Patient was found down post-fall after family had not heard from him x 3 days and called EMS. 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   ED Course: On ED arrival, vitals notable for HR 77, BP 67/45, afebrile. Labs were notable for WBC 28.5, stable H&H, normal Plt (190). INR elevated 1.5. Na 133, K 4.9, CO2 19, Cr 3.77 (baseline 1.2 2021). Transaminases elevated to 561/177, Tbili 29.6. Ammonia 51, LA 1.8. Trop 74 (69) and BNP 88.7. CK 1913 with concern for rhabdomyolysis. CT Head NAICA. CT A/P notable for irregular 3.3cm pancreatic tail mass c/f neoplasm as well as "innumerable" metastases measuring up to 4.4cm.   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   Total Time 15 minutes Prolonged Time Billed  no       Greater than 50%  of this time was spent counseling and coordinating care related to the above assessment and plan.  Lavena Bullion, NP  Please contact Palliative Medicine Team phone at [phone] for questions and concerns.

## 2021-10-10 DIAGNOSIS — K8689 Other specified diseases of pancreas: Secondary | ICD-10-CM | POA: Diagnosis not present

## 2021-10-10 DIAGNOSIS — N179 Acute kidney failure, unspecified: Secondary | ICD-10-CM | POA: Diagnosis not present

## 2021-10-10 DIAGNOSIS — Z515 Encounter for palliative care: Secondary | ICD-10-CM | POA: Diagnosis not present

## 2021-10-10 DIAGNOSIS — R4182 Altered mental status, unspecified: Secondary | ICD-10-CM | POA: Diagnosis not present

## 2021-10-10 MED ORDER — HYDROXYZINE HCL 10 MG PO TABS
10.0000 mg | ORAL_TABLET | Freq: Three times a day (TID) | ORAL | Status: DC | PRN
  Filled 2021-10-10: qty 1

## 2021-10-10 NOTE — Progress Notes (Signed)
PROGRESS NOTE    Colin Allen.  XBW:[id number] DOB: [birth year] DOA: 10/04/2021 PCP: Colin Noble, MD   Brief Narrative:  60 year old man who presented to [facility] via EMS 11/9 for AMS, weakness, possible sepsis. Found down post-fall after family had not heard from him x 3 days. Jaundiced and hypotensive on EMS arrival. 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   On ED arrival, vitals notable for HR 77, BP 67/45, afebrile. Labs were notable for WBC 28.5, stable H&H, normal Plt (190). INR elevated 1.5. Na 133, K 4.9, CO2 19, Cr 3.77 (baseline 1.2 2021). Transaminases elevated to 561/177, Tbili 29.6. Ammonia 51, LA 1.8. Trop 74 (69) and BNP 88.7. CK 1913 with concern for rhabdomyolysis. CT Head NAICA. CT A/P notable for irregular 3.3cm pancreatic tail mass c/f neoplasm as well as "innumerable" metastases measuring up to 4.4cm.   Patient was transferred to [facility] 11/9 for further evaluation and management and PCCM consulted for admission. Given new diagnosis of pancreatic mass with significant metastases, decompensated EtOH cirrhosis and new renal failure, PMT consulted for assistance with management and GOC. Ultimately, patient/family (sister, mother) opted for DNR status with transition to comfort measures only.  Patient was transferred under [name] today on 10/10/2021.  Significant Hospital Events: Including procedures, antibiotic start and stop dates in addition to other pertinent events   11/8 - Presented to [facility] via EMS for AMS after being found down, unknown length of time. C/f rhabdo with elevated CK/AKI. Also with transaminitis + hyperbilirubinemia. CT A/P with new pancreatic tail mass + mets. Started cefepime, and vanc (got one dose flagyl)  11/9 - Transferred to [facility] for further management.Arrived from [facility]. Remains hypotensive with SBPs 90s Multiple significant lab abnormalities including LFTs, CK/Cr CT A/P with new  pancreatic tail mass + ?mets 11/10 Making urine. Bicarb gtt placed on hold as Ca dropped. Still on pressors. Renal Fxn worse. DNR status. 11/13 Transition to Comfort Care. 11/14 Ongoing PMT assistance. Continues to itch, Benadryl scheduled, Dexamethasone x 1. Remains Comfort Care.  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  Patient comfortable.  Sister at the bedside.  Continue current care.  DVT prophylaxis:    Code Status: DNR  Family Communication: Sister at the bedside.  Status is: Inpatient  Remains inpatient appropriate because: Comfort care.  Estimated body mass index is 29.93 kg/m as calculated from the following:   Height as of this encounter: 6' (1.829 m).   Weight as of this encounter: 100.1 kg.     Nutritional Assessment: Body mass index is 29.93 kg/m.Marland Kitchen Seen by dietician.  I agree with the assessment and plan as outlined below: Nutrition Status:   Skin Assessment: I have examined the patient's skin and I agree with the wound assessment as performed by the wound care RN as outlined below:    Consultants:  Palliative care  Procedures:  None  Antimicrobials:  Anti-infectives (From admission, onward)    Start     Dose/Rate Route  Frequency Ordered Stop   10/04/21 2315  vancomycin (VANCOREADY) IVPB 1250 mg/250 mL        1,250 mg 166.7 mL/hr over 90 Minutes Intravenous  Once 10/04/21 2216 10/05/21 0028   10/04/21 2217   vancomycin variable dose per unstable renal function (pharmacist dosing)  Status:  Discontinued         Does not apply See admin instructions 10/04/21 2217 10/05/21 1805   10/04/21 1800  ceFEPIme (MAXIPIME) 2 g in sodium chloride 0.9 % 100 mL IVPB  Status:  Discontinued        2 g 200 mL/hr over 30 Minutes Intravenous Every 24 hours 10/04/21 0432 10/05/21 1805   10/04/21 1000  rifaximin (XIFAXAN) tablet 550 mg  Status:  Discontinued        550 mg Oral 2 times daily 10/04/21 0417 10/05/21 1805          Subjective: Patient seen and examined.  Patient very comfortable.  Arousable.  Slightly confused.  Sister at the bedside.  Objective: Vitals:   10/09/21 0500 10/09/21 0600 10/09/21 0757 10/10/21 0721  BP:      Pulse: 71 71  75  Resp: (!) 7 (!) 7  16  Temp:      TempSrc:      SpO2: 95% 95% 91% 94%  Weight:      Height:        Intake/Output Summary (Last 24 hours) at 10/10/2021 1124 Last data filed at 10/10/2021 0300 Gross per 24 hour  Intake 392.34 ml  Output --  Net 392.34 ml   Filed Weights   10/06/21 0448 10/07/21 0500 10/08/21 0800  Weight: 104 kg 100.1 kg 100.1 kg    Examination:  General exam: Appears calm and comfortable, obese, icteric Respiratory system: Clear to auscultation. Respiratory effort normal. Cardiovascular system: S1 & S2 heard, RRR. No JVD, murmurs, rubs, gallops or clicks.  +3 pitting edema bilateral lower extremity. Gastrointestinal system: Abdomen is nondistended, soft and nontender. No organomegaly or masses felt. Normal bowel sounds heard. Central nervous system: Slightly lethargic, partly oriented.. No focal neurological deficits.  Data Reviewed: I have personally reviewed following labs and imaging studies  CBC: Recent Labs  Lab 10/03/21 1728 10/04/21 0332 10/04/21 1219 10/05/21 0654 10/06/21 0527  WBC 28.5* 24.4*  --  18.8* 14.1*  NEUTROABS 25.5*  --   --   --  11.7*  HGB 11.3* 10.3* 9.2* 9.0* 9.2*  HCT 33.5* 31.0* 27.0* 26.8*  27.0*  MCV 95.2 95.1  --  91.5 92.2  PLT 190 156  --  144* 664*   Basic Metabolic Panel: Recent Labs  Lab 10/04/21 0332 10/04/21 0917 10/04/21 1219 10/04/21 1535 10/04/21 2012 10/05/21 0654 10/05/21 0658 10/05/21 2247 10/06/21 0527  NA 132* 132* 135 133* 130* 131*  --   --  131*  K 4.4 4.5 4.2 4.3 4.2 3.6  --   --  3.4*  CL 101 102  --  102 97* 96*  --   --  95*  CO2 15* 12*  --  14* 16* 18*  --   --  17*  GLUCOSE 138* 101*  --  132* 166* 112*  --   --  123*  BUN 66* 71*  --  75* 77* 80*  --   --  85*  CREATININE 4.15* 4.21*  --  4.52* 4.48* 4.75*  --  5.41* 5.34*  CALCIUM 6.4* 6.1*  --  5.9* 6.0* 6.1* 6.1*  --  5.7*  MG 2.7*  --   --   --   --  2.6*  --   --  2.4  PHOS 11.8*  --   --   --   --  8.6*  --   --   --    GFR: Estimated Creatinine Clearance: 18.2 mL/min (A) (by C-G formula based on SCr of 5.34 mg/dL (H)). Liver Function Tests: Recent Labs  Lab 10/03/21 1728 10/04/21 0332  AST 561* 346*  ALT 177* 143*  ALKPHOS 501* 396*  BILITOT 29.6* 24.9*  PROT 6.9 5.3*  ALBUMIN 1.8* <1.5*   Recent Labs  Lab 10/03/21 1728  LIPASE 35   Recent Labs  Lab 10/03/21 1813 10/04/21 0703  AMMONIA 51* 43*   Coagulation Profile: Recent Labs  Lab 10/03/21 1728  INR 1.5*   Cardiac Enzymes: Recent Labs  Lab 10/03/21 1728 10/04/21 0332 10/04/21 0917 10/04/21 1535 10/04/21 2012  CKTOTAL 1,913* 1,585* 1,537* 1,581* 1,525*   BNP (last 3 results) No results for input(s): PROBNP in the last 8760 hours. HbA1C: No results for input(s): HGBA1C in the last 72 hours. CBG: Recent Labs  Lab 10/04/21 2309 10/05/21 0403 10/05/21 0708 10/05/21 1107 10/05/21 1538  GLUCAP 170* 129* 119* 147* 139*   Lipid Profile: No results for input(s): CHOL, HDL, LDLCALC, TRIG, CHOLHDL, LDLDIRECT in the last 72 hours. Thyroid Function Tests: No results for input(s): TSH, T4TOTAL, FREET4, T3FREE, THYROIDAB in the last 72 hours. Anemia Panel: No results for input(s): VITAMINB12,  FOLATE, FERRITIN, TIBC, IRON, RETICCTPCT in the last 72 hours. Sepsis Labs: Recent Labs  Lab 10/03/21 1813 10/04/21 0917  LATICACIDVEN 1.8 1.3    Recent Results (from the past 240 hour(s))  Resp Panel by RT-PCR (Flu A&B, Covid) Nasopharyngeal Swab     Status: None   Collection Time: 10/03/21 10:31 PM   Specimen: Nasopharyngeal Swab; Nasopharyngeal(NP) swabs in vial transport medium  Result Value Ref Range Status   SARS Coronavirus 2 by RT PCR NEGATIVE NEGATIVE Final    Comment: (NOTE) SARS-CoV-2 target nucleic acids are NOT DETECTED.  The SARS-CoV-2 RNA is generally detectable in upper respiratory specimens during the acute phase of infection. The lowest concentration of SARS-CoV-2 viral copies this assay can detect is 138 copies/mL. A negative result does not preclude SARS-Cov-2 infection and should not be used as the sole basis for treatment or other patient management decisions. A negative result may occur with  improper specimen collection/handling, submission of specimen other than nasopharyngeal swab, presence of viral mutation(s) within the areas targeted by this assay, and inadequate number of viral copies(<138 copies/mL). A negative result must be combined with clinical observations, patient history, and epidemiological information. The expected result is Negative.  Fact Sheet for Patients:  EntrepreneurPulse.com.au  Fact Sheet for Healthcare Providers:  IncredibleEmployment.be  This test is no t yet approved or cleared by the [place] FDA and  has been authorized for detection and/or diagnosis of SARS-CoV-2 by FDA under an Emergency Use Authorization (EUA). This EUA will remain  in effect (meaning this test can be used) for the duration of the COVID-19 declaration under Section 564(b)(1) of the Act, 21 U.S.C.section 360bbb-3(b)(1), unless the authorization is terminated  or revoked sooner.       Influenza A by PCR  NEGATIVE NEGATIVE Final   Influenza B by PCR NEGATIVE NEGATIVE Final    Comment: (NOTE) The Xpert Xpress SARS-CoV-2/FLU/RSV plus assay is intended as an aid in the diagnosis of influenza from Nasopharyngeal swab specimens and should not be used as a sole basis for treatment. Nasal washings and aspirates are  unacceptable for Xpert Xpress SARS-CoV-2/FLU/RSV testing.  Fact Sheet for Patients: EntrepreneurPulse.com.au  Fact Sheet for Healthcare Providers: IncredibleEmployment.be  This test is not yet approved or cleared by the [place] FDA and has been authorized for detection and/or diagnosis of SARS-CoV-2 by FDA under an Emergency Use Authorization (EUA). This EUA will remain in effect (meaning this test can be used) for the duration of the COVID-19 declaration under Section 564(b)(1) of the Act, 21 U.S.C. section 360bbb-3(b)(1), unless the authorization is terminated or revoked.  Performed at [facility], [address] [place], [place] [address]   MRSA Next Gen by PCR, Nasal     Status: None   Collection Time: 10/04/21  1:54 AM   Specimen: Nasal Mucosa; Nasal Swab  Result Value Ref Range Status   MRSA by PCR Next Gen NOT DETECTED NOT DETECTED Final    Comment: (NOTE) The GeneXpert MRSA Assay (FDA approved for NASAL specimens only), is one component of a comprehensive MRSA colonization surveillance program. It is not intended to diagnose MRSA infection nor to guide or monitor treatment for MRSA infections. Test performance is not FDA approved in patients less than 46 years old. Performed at [facility], [place] [address] [place], [place] [address]   Culture, blood (routine x 2)     Status: None   Collection Time: 10/04/21  9:17 AM   Specimen: BLOOD RIGHT HAND  Result Value Ref Range Status   Specimen Description BLOOD RIGHT HAND  Final   Special Requests   Final    BOTTLES DRAWN AEROBIC ONLY Blood Culture results  may not be optimal due to an inadequate volume of blood received in culture bottles   Culture   Final    NO GROWTH 5 DAYS Performed at [facility], [place] [address] [place], [place] [address]    Report Status 10/09/2021 FINAL  Final  Culture, blood (routine x 2)     Status: None   Collection Time: 10/04/21 11:45 AM   Specimen: BLOOD  Result Value Ref Range Status   Specimen Description BLOOD RIGHT ANTECUBITAL  Final   Special Requests   Final    BOTTLES DRAWN AEROBIC AND ANAEROBIC Blood Culture results may not be optimal due to an inadequate volume of blood received in culture bottles   Culture   Final    NO GROWTH 5 DAYS Performed at [facility], [place] [address], [place], [place] [address]    Report Status 10/09/2021 FINAL  Final  Urine Culture     Status: Abnormal   Collection Time: 10/04/21  7:37 PM   Specimen: Urine, Clean Catch  Result Value Ref Range Status   Specimen Description URINE, CLEAN CATCH  Final   Special Requests NONE  Final   Culture (A)  Final    <10,000 COLONIES/mL INSIGNIFICANT GROWTH Performed at [facility], [place] [address] [place], [place] [address]    Report Status 10/05/2021 FINAL  Final      Radiology Studies: No results found.  Scheduled Meds:  antiseptic oral rinse  15 mL Topical BID   diphenhydrAMINE  50 mg Intravenous Q6H   mometasone-formoterol  2 puff Inhalation BID   Continuous Infusions:  fentaNYL infusion INTRAVENOUS 200 mcg/hr (10/10/21 0300)     LOS: 6 days   Time spent: 28 minutes   Darliss Cheney, MD [facility]  10/10/2021, 11:24 AM  Please page via [facility] and do not message via secure chat for anything urgent.  Secure chat can be used for anything non urgent.  How to contact the [facility] Attending or Consulting provider [place] or covering provider during after hours [place], for this patient?  Check the care team in [facility] and look for a) attending/consulting [facility] provider listed and b) the [facility] team listed.  Page or secure chat [phone]. Log into [email or web address] and use [place]'s universal password to access. If you do not have the password, please contact the hospital operator. Locate the [facility] provider you are looking for under [facility] and page to a number that you can be directly reached. If you still have difficulty reaching the provider, please page the [facility] (Director on Call) for the Hospitalists listed on amion for assistance.

## 2021-10-10 NOTE — Progress Notes (Signed)
Manufacturing engineer [facility]) Hospital Liaison note.    Received request to re-open referral for [facility]. Chart and pt information have been reviewed by [facility] physician.  Hospice eligibility confirmed.  Hospice Home is unable to offer a room today. Hospital Liaison will follow up tomorrow or sooner if a room becomes available. Please do not hesitate to call with questions.    Thank you for the opportunity to participate in this patient's care.  Domenic Moras, BSN, RN [facility] Liaison (listed on [place] under Hospice/[facility])    [phone] (24h on call)

## 2021-10-10 NOTE — Progress Notes (Signed)
Daily Progress Note   Patient Name: Colin Allen.       Date: 10/10/2021 DOB: 12-02-1960  Age: 60 y.o. MRN#: [id number] Attending Physician: Darliss Cheney, MD Primary Care Physician: Marden Noble, MD Admit Date: 10/04/2021  Reason for Consultation/Follow-up: end of life care  Subjective: Patient appears comfortable. Fentanyl infusion is at 200 mcg/hr. He is sleeping and I did not attempt to wake him. No non-verbal signs of pain or discomfort noted. Respirations are even and unlabored. No excessive respiratory secretions noted. His extremities are warm and his vital signs are stable in the setting of EOL.   [name] is present at bedside. She reports patient still awakens periodically and is able to interact with family. Education and counseling provided on expectations at EOL. Discussed that he appeared stable for transport to a hospice facility. Abigail Butts states the family lives in [place] and it would be much easier for them if he can be transported to the hospice house in [place]. I provided reassurance that if he becomes unstable at any time, we will cancel this transfer and he can remain in-house for EOL.     Length of Stay: 6  Current Medications: Scheduled Meds:  . antiseptic oral rinse  15 mL Topical BID  . diphenhydrAMINE  50 mg Intravenous Q6H  . mometasone-formoterol  2 puff Inhalation BID    Continuous Infusions: . fentaNYL infusion INTRAVENOUS 200 mcg/hr (10/10/21 0300)    PRN Meds: acetaminophen **OR** acetaminophen, albuterol, camphor-menthol, diphenhydrAMINE, diphenhydrAMINE-zinc acetate, fentaNYL, glycopyrrolate **OR** glycopyrrolate **OR** glycopyrrolate, haloperidol **OR** haloperidol **OR** haloperidol lactate, lip balm, LORazepam, ondansetron  **OR** ondansetron (ZOFRAN) IV, polyvinyl alcohol, white petrolatum         Vital Signs: BP (!) 78/51 (BP Location: Left Arm)   Pulse 75   Temp 98.8 F (37.1 C) (Oral)   Resp 16   Ht 6' (1.829 m)   Wt 100.1 kg   SpO2 94%   BMI 29.93 kg/m  SpO2: SpO2: 94 % O2 Device: O2 Device: Room Air O2 Flow Rate:      LBM: Last BM Date: 10/08/21 Baseline Weight: Weight: 97.5 kg Most recent weight: Weight: 100.1 kg       Palliative Assessment/Data: PPS 20%      Palliative Care Assessment & Plan   Patient Profile: 60 y.o.  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 Patient was found down post-fall after family had not heard from him x 3 days and called EMS. 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   ED Course: On ED arrival, vitals notable for HR 77, BP 67/45, afebrile. Labs were notable for WBC 28.5, stable H&H, normal Plt (190). INR elevated 1.5. Na 133, K 4.9, CO2 19, Cr 3.77 (baseline 1.2 2021). Transaminases elevated to 561/177, Tbili 29.6. Ammonia 51, LA 1.8. Trop 74 (69) and BNP 88.7. CK 1913 with concern for rhabdomyolysis. CT Head NAICA. CT A/P notable for irregular 3.3cm pancreatic tail mass c/f neoplasm as well as "innumerable" metastases measuring up to 4.4cm.   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   Total Time 25 minutes Prolonged Time Billed  no       Greater than 50%  of this time was spent counseling and coordinating care related to the above assessment and plan.  Lavena Bullion, NP  Please contact Palliative Medicine Team phone at [phone] for questions and concerns.

## 2021-10-11 DIAGNOSIS — C787 Secondary malignant neoplasm of liver and intrahepatic bile duct: Secondary | ICD-10-CM

## 2021-10-11 DIAGNOSIS — N170 Acute kidney failure with tubular necrosis: Secondary | ICD-10-CM

## 2021-10-11 DIAGNOSIS — M6282 Rhabdomyolysis: Secondary | ICD-10-CM | POA: Diagnosis not present

## 2021-10-11 DIAGNOSIS — N179 Acute kidney failure, unspecified: Secondary | ICD-10-CM | POA: Diagnosis not present

## 2021-10-11 DIAGNOSIS — A419 Sepsis, unspecified organism: Secondary | ICD-10-CM | POA: Diagnosis not present

## 2021-10-11 MED ORDER — FENTANYL 2500MCG IN NS 250ML (10MCG/ML) PREMIX INFUSION: 0.0000 ug/h | INTRAVENOUS | 0 refills | Status: AC

## 2021-10-11 MED ORDER — GLYCOPYRROLATE 1 MG PO TABS: 1.0000 mg | ORAL_TABLET | ORAL | Status: AC | PRN

## 2021-10-11 NOTE — Care Management (Signed)
Consents signed. [facility] ready for patient to be transferred to [facility]. IV drip will need to be capped. [facility] requesting NSL to be left.   RN to call report to call report to [phone]   [facility] and DNR form on chart. [facility] called.

## 2021-10-11 NOTE — Care Management Important Message (Signed)
Important Message  Patient Details  Name: Colin Allen. MRN: [id number] Date of Birth: Feb 21, 1961   Medicare Important Message Given:  Yes  Patient left prior to IM delivery will mail to the patient home address.   [name] 10/11/2021, 2:45 PM

## 2021-10-11 NOTE — Discharge Summary (Signed)
Physician Discharge Summary  Colin Allen. GGY:[id number] DOB: January 27, 1961 DOA: 10/04/2021  PCP: Marden Noble, MD  Admit date: 10/04/2021 Discharge date: 10/11/2021  Admitted From: home Disposition: Residential hospice facility  Recommendations for Outpatient Follow-up:  Who alterations to hospice facility.  Discharge Condition:Hospice CODE STATUS:DNR Diet recommendation: Comfort feeds  Brief/Interim Summary: 60 year old who came in [facility] via EMS on 11 9 for altered mental status and weakness was found down by his family last seen normal 3 days prior to admission, found to be in septic shock, rhabdomyolysis, jaundice and hypotensive admitted by [facility] with a blood pressure in the 60s white count of 28,000 INR 1.5 potassium of 5 creatinine of 3 liver enzymes were 502 100, T bili of 30 CK of 2000 CT scan of the abdomen and pelvis showed 3 cm pancreatic mass tail  with significant hepatic metastases, also decompensated alcoholic cirrhosis with renal failure.  Was started on pressors, antibiotics fluid with poor response, nephrology was consulted and dialysis was started.  After a Long discussion with the family palliative care was consulted and ultimately family decided to move towards comfort care patient was made a DNR.  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 Diabetes mellitus type 2 Normocytic anemia without evidence of bleeding Hyponatremia Hypokalemia Acute metabolic acidosis   Discharge Instructions  Discharge Instructions     Diet - low sodium heart healthy   Complete by: As directed    Increase activity slowly   Complete by: As directed       Allergies as of 10/11/2021       Reactions   Lisinopril Swelling   Other reaction(s):  Angioedema   Bee Venom Swelling   Acetaminophen Other (See Comments)   Liver disease Liver disease        Medication List     STOP taking these medications    albuterol 108 (90 Base) MCG/ACT inhaler Commonly known as: VENTOLIN HFA   amLODipine 10 MG tablet Commonly known as: NORVASC   atorvastatin 40 MG tablet Commonly known as: LIPITOR   budesonide-formoterol 160-4.5 MCG/ACT inhaler Commonly known as: SYMBICORT   fenofibrate 145 MG tablet Commonly known as: TRICOR   folic acid 1 MG tablet Commonly known as: FOLVITE   HumaLOG Mix 75/25 KwikPen (75-25) 100 UNIT/ML Kwikpen Generic drug: Insulin Lispro Prot & Lispro   Lantus SoloStar 100 UNIT/ML Solostar Pen Generic drug: insulin glargine   levothyroxine 200 MCG tablet Commonly known as: SYNTHROID   linaclotide 290 MCG Caps capsule Commonly known as: LINZESS   lisinopril 10 MG tablet Commonly known as: ZESTRIL   metoprolol succinate 25 MG 24 hr tablet Commonly known as: TOPROL-XL   oxyCODONE-acetaminophen 7.5-325 MG tablet Commonly known as: PERCOCET   terazosin 5 MG capsule Commonly known as: HYTRIN   Vitamin D (Ergocalciferol) 1.25 MG (50000 UNIT) Caps capsule Commonly known as: DRISDOL   zolpidem 10 MG tablet Commonly known as: AMBIEN       TAKE these medications    fentaNYL 10 mcg/ml Soln infusion Inject 0-400 mcg/hr into the vein continuous.   glycopyrrolate 1 MG tablet Commonly known as: ROBINUL Take 1 tablet (1 mg total) by mouth every 4 (four) hours as needed (excessive secretions).        Allergies  Allergen Reactions   Lisinopril Swelling    Other reaction(s): Angioedema  Bee Venom Swelling   Acetaminophen Other (See Comments)    Liver disease Liver disease     Consultations: Nephrology Critical care   Procedures/Studies: CT ABDOMEN PELVIS WO CONTRAST  Result Date: 10/03/2021 CLINICAL DATA:  Liver masses on right upper quadrant ultrasound. Flank pain with  abnormal LFTs. EXAM: CT ABDOMEN AND PELVIS WITHOUT CONTRAST TECHNIQUE: Multidetector CT imaging of the abdomen and pelvis was performed following the standard protocol without IV contrast. COMPARISON:  Right upper quadrant ultrasound dated 10/03/2021. CT abdomen dated 11/18/2019. CT abdomen/pelvis dated 07/12/2014. FINDINGS: Lower chest: Trace right pleural effusion with mild bibasilar atelectasis. Hepatobiliary: Nodular hepatic contour. Innumerable hepatic lesions, including a dominant 4.4 cm mass in segment 6 (series 2/image 40), compatible with metastasis. Suspected cholecystectomy. No intrahepatic ductal dilatation. Multiple mid/distal CBD stones measuring up to 11 mm (series 2/image 51). Pancreas: 3.3 cm irregular mass in the pancreatic tail (series 2/image 44), worrisome for pancreatic neoplasm given the additional findings. Spleen: Within normal limits. Mass abuts but does not definitely invade the spleen at the splenic hilum (series 2/image 45). Adrenals/Urinary Tract: Adrenal glands are within normal limits. Kidneys are within normal limits. No renal calculi or hydronephrosis. Bladder is within normal limits. Stomach/Bowel: Stomach is within normal limits. No evidence of bowel obstruction. Normal appendix (series 2/image 83). Left colon is decompressed. Vascular/Lymphatic: No evidence of abdominal aortic aneurysm. Atherosclerotic calcifications of the abdominal aorta and branch vessels. Small upper abdominal lymph nodes, including a 11 mm node in the porta hepatis (series 2/image 39). Reproductive: Prostate is unremarkable. Other: Trace pelvic ascites. Musculoskeletal: Degenerative changes of the visualized thoracolumbar spine. IMPRESSION: 3.3 cm irregular mass in the pancreatic tail, worrisome for pancreatic neoplasm. Innumerable metastases, measuring up to 4.4 cm in segment 6. Suspected cholecystectomy. No intrahepatic ductal dilatation. Multiple mid/distal CBD stones measuring up to 11 mm. Electronically  Signed   By: Julian Hy M.D.   On: 10/03/2021 20:40   DG Chest 2 View  Result Date: 10/03/2021 CLINICAL DATA:  Altered mental status. EXAM: CHEST - 2 VIEW COMPARISON:  Chest CT February 07, 2018 FINDINGS: Patient is rotated. The heart size and mediastinal contours are within normal limits. No focal consolidation. No pleural effusion. No pneumothorax. The visualized skeletal structures are unremarkable. IMPRESSION: No acute cardiopulmonary disease. Electronically Signed   By: Dahlia Bailiff M.D.   On: 10/03/2021 18:53   CT Head Wo Contrast  Result Date: 10/03/2021 CLINICAL DATA:  Head trauma. Mental status change. Weakness. Found on floor. EXAM: CT HEAD WITHOUT CONTRAST CT CERVICAL SPINE WITHOUT CONTRAST TECHNIQUE: Multidetector CT imaging of the head and cervical spine was performed following the standard protocol without intravenous contrast. Multiplanar CT image reconstructions of the cervical spine were also generated. COMPARISON:  None. FINDINGS: CT HEAD FINDINGS Brain: There is no evidence of an acute infarct, intracranial hemorrhage, mass, midline shift, or extra-axial fluid collection. 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 Cerebral atrophy is mild-to-moderate for age. Vascular: Calcified atherosclerosis at the skull base. No hyperdense vessel. Skull: No fracture or suspicious osseous lesion. Sinuses/Orbits: Small volume left sphenoid sinus fluid. Clear mastoid air cells. Unremarkable orbits. Other: None. CT CERVICAL SPINE FINDINGS Alignment: Normal. Skull base and vertebrae: No acute fracture or suspicious osseous lesion. Soft tissues and spinal canal: No prevertebral fluid or swelling. No visible canal hematoma. Disc levels:  Mild cervical spondylosis and facet arthrosis. Upper chest: Mild scarring in the lung apices. Other: Moderate calcific atherosclerosis at the carotid bifurcations. IMPRESSION: 1. No evidence  of acute intracranial abnormality. 2.  Cerebral Atrophy  (ICD10-G31.9). 3. No acute cervical spine fracture or traumatic subluxation. Electronically Signed   By: Logan Bores M.D.   On: 10/03/2021 18:55   CT Cervical Spine Wo Contrast  Result Date: 10/03/2021 CLINICAL DATA:  Head trauma. Mental status change. Weakness. Found on floor. EXAM: CT HEAD WITHOUT CONTRAST CT CERVICAL SPINE WITHOUT CONTRAST TECHNIQUE: Multidetector CT imaging of the head and cervical spine was performed following the standard protocol without intravenous contrast. Multiplanar CT image reconstructions of the cervical spine were also generated. COMPARISON:  None. FINDINGS: CT HEAD FINDINGS Brain: There is no evidence of an acute infarct, intracranial hemorrhage, mass, midline shift, or extra-axial fluid collection. 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 Cerebral atrophy is mild-to-moderate for age. Vascular: Calcified atherosclerosis at the skull base. No hyperdense vessel. Skull: No fracture or suspicious osseous lesion. Sinuses/Orbits: Small volume left sphenoid sinus fluid. Clear mastoid air cells. Unremarkable orbits. Other: None. CT CERVICAL SPINE FINDINGS Alignment: Normal. Skull base and vertebrae: No acute fracture or suspicious osseous lesion. Soft tissues and spinal canal: No prevertebral fluid or swelling. No visible canal hematoma. Disc levels:  Mild cervical spondylosis and facet arthrosis. Upper chest: Mild scarring in the lung apices. Other: Moderate calcific atherosclerosis at the carotid bifurcations. IMPRESSION: 1. No evidence of acute intracranial abnormality. 2.  Cerebral Atrophy (ICD10-G31.9). 3. No acute cervical spine fracture or traumatic subluxation. Electronically Signed   By: Logan Bores M.D.   On: 10/03/2021 18:55   US RENAL  Result Date: 10/04/2021 CLINICAL DATA:  r EXAM: RENAL / URINARY TRACT ULTRASOUND COMPLETE COMPARISON:  None. FINDINGS: Right Kidney: Renal measurements: 12.4 x 6.6 x 7.4 cm = volume: 316.6 mL. Echogenicity within  normal limits. No mass or hydronephrosis visualized. Left Kidney: Renal measurements: 13.9 x 6.8 x 7.3 cm = volume: 364.4 mL. Echogenicity within normal limits. No mass or hydronephrosis visualized. There is possible trace amount of fluid adjacent to the left kidney. Bladder: Appears normal for degree of bladder distention. Other: Spleen is enlarged measuring 14.8 cm in maximum diameter. There is inhomogeneous echogenicity in the visualized portions of liver along with space-occupying lesions, possibly metastatic disease. This finding is not fully evaluated. IMPRESSION: No significant sonographic abnormality is seen in the kidneys. There is possible trace amount of left perinephric fluid collection. Enlarged spleen. Possible space-occupying lesions in the liver suggesting metastatic disease. Electronically Signed   By: Elmer Picker M.D.   On: 10/04/2021 12:45   DG Chest Port 1 View  Result Date: 10/05/2021 CLINICAL DATA:  Shortness of breath EXAM: PORTABLE CHEST 1 VIEW COMPARISON:  Chest x-ray 10/03/2021 FINDINGS: Heart appears mildly enlarged. Mediastinum is stable. Mild likely subsegmental atelectasis at the left lung base. No pleural effusion or pneumothorax visualized. IMPRESSION: Likely subsegmental atelectasis at the left lung base. Electronically Signed   By: Ofilia Neas M.D.   On: 10/05/2021 08:27   US Abdomen Limited RUQ (LIVER/GB)  Result Date: 10/03/2021 CLINICAL DATA:  The LFTs. EXAM: ULTRASOUND ABDOMEN LIMITED RIGHT UPPER QUADRANT COMPARISON:  CT abdomen pelvis dated 11/18/2019. FINDINGS: Gallbladder: The gallbladder is not visualized and may be contracted or surgically absent. Common bile duct: Diameter: 6 mm Liver: The liver demonstrates a heterogeneous echotexture. Multiple hypoechoic masses noted throughout the liver. The largest mass measures 8.7 x 8.2 x 4.2 cm in the right lobe of the liver. Findings concerning for metastatic disease. There is slight irregularity of the liver  contour which may  represent changes of cirrhosis. Portal vein is patent on color Doppler imaging with normal direction of blood flow towards the liver. Other: None. IMPRESSION: Multiple liver masses concerning for metastatic disease. CT pending. Electronically Signed   By: Anner Crete M.D.   On: 10/03/2021 20:18   (Echo, Carotid, EGD, Colonoscopy, ERCP)    Subjective: Nonverbal  Discharge Exam: Vitals:   10/11/21 0214 10/11/21 0818  BP: (!) 94/56 (!) 96/57  Pulse: 71 75  Resp: 13   Temp: 98.4 F (36.9 C) 98.2 F (36.8 C)  SpO2: 92% (!) 89%   Vitals:   10/10/21 1514 10/10/21 1947 10/11/21 0214 10/11/21 0818  BP: (!) 96/44  (!) 94/56 (!) 96/57  Pulse: 74  71 75  Resp: 15  13   Temp: 97.7 F (36.5 C)  98.4 F (36.9 C) 98.2 F (36.8 C)  TempSrc: Oral  Oral Oral  SpO2: 100% 99% 92% (!) 89%  Weight:      Height:        General: Pt is alert, awake, not in acute distress Cardiovascular: RRR, S1/S2 +, no rubs, no gallops Respiratory: CTA bilaterally, no wheezing, no rhonchi Abdominal: Soft, NT, ND, bowel sounds + Extremities: no edema, no cyanosis    The results of significant diagnostics from this hospitalization (including imaging, microbiology, ancillary and laboratory) are listed below for reference.     Microbiology: Recent Results (from the past 240 hour(s))  Resp Panel by RT-PCR (Flu A&B, Covid) Nasopharyngeal Swab     Status: None   Collection Time: 10/03/21 10:31 PM   Specimen: Nasopharyngeal Swab; Nasopharyngeal(NP) swabs in vial transport medium  Result Value Ref Range Status   SARS Coronavirus 2 by RT PCR NEGATIVE NEGATIVE Final    Comment: (NOTE) SARS-CoV-2 target nucleic acids are NOT DETECTED.  The SARS-CoV-2 RNA is generally detectable in upper respiratory specimens during the acute phase of infection. The lowest concentration of SARS-CoV-2 viral copies this assay can detect is 138 copies/mL. A negative result does not preclude  SARS-Cov-2 infection and should not be used as the sole basis for treatment or other patient management decisions. A negative result may occur with  improper specimen collection/handling, submission of specimen other than nasopharyngeal swab, presence of viral mutation(s) within the areas targeted by this assay, and inadequate number of viral copies(<138 copies/mL). A negative result must be combined with clinical observations, patient history, and epidemiological information. The expected result is Negative.  Fact Sheet for Patients:  EntrepreneurPulse.com.au  Fact Sheet for Healthcare Providers:  IncredibleEmployment.be  This test is no t yet approved or cleared by the [place] FDA and  has been authorized for detection and/or diagnosis of SARS-CoV-2 by FDA under an Emergency Use Authorization (EUA). This EUA will remain  in effect (meaning this test can be used) for the duration of the COVID-19 declaration under Section 564(b)(1) of the Act, 21 U.S.C.section 360bbb-3(b)(1), unless the authorization is terminated  or revoked sooner.       Influenza A by PCR NEGATIVE NEGATIVE Final   Influenza B by PCR NEGATIVE NEGATIVE Final    Comment: (NOTE) The Xpert Xpress SARS-CoV-2/FLU/RSV plus assay is intended as an aid in the diagnosis of influenza from Nasopharyngeal swab specimens and should not be used as a sole basis for treatment. Nasal washings and aspirates are unacceptable for Xpert Xpress SARS-CoV-2/FLU/RSV testing.  Fact Sheet for Patients: EntrepreneurPulse.com.au  Fact Sheet for Healthcare Providers: IncredibleEmployment.be  This test is not yet approved or cleared by the [place]  FDA and has been authorized for detection and/or diagnosis of SARS-CoV-2 by FDA under an Emergency Use Authorization (EUA). This EUA will remain in effect (meaning this test can be used) for the duration of  the COVID-19 declaration under Section 564(b)(1) of the Act, 21 U.S.C. section 360bbb-3(b)(1), unless the authorization is terminated or revoked.  Performed at [facility], [address] [place], [place] [address]   MRSA Next Gen by PCR, Nasal     Status: None   Collection Time: 10/04/21  1:54 AM   Specimen: Nasal Mucosa; Nasal Swab  Result Value Ref Range Status   MRSA by PCR Next Gen NOT DETECTED NOT DETECTED Final    Comment: (NOTE) The GeneXpert MRSA Assay (FDA approved for NASAL specimens only), is one component of a comprehensive MRSA colonization surveillance program. It is not intended to diagnose MRSA infection nor to guide or monitor treatment for MRSA infections. Test performance is not FDA approved in patients less than 62 years old. Performed at [facility], [place] [address], [place], [place] [address]   Culture, blood (routine x 2)     Status: None   Collection Time: 10/04/21  9:17 AM   Specimen: BLOOD RIGHT HAND  Result Value Ref Range Status   Specimen Description BLOOD RIGHT HAND  Final   Special Requests   Final    BOTTLES DRAWN AEROBIC ONLY Blood Culture results may not be optimal due to an inadequate volume of blood received in culture bottles   Culture   Final    NO GROWTH 5 DAYS Performed at [facility], [place] [address] [place], [place] [address]    Report Status 10/09/2021 FINAL  Final  Culture, blood (routine x 2)     Status: None   Collection Time: 10/04/21 11:45 AM   Specimen: BLOOD  Result Value Ref Range Status   Specimen Description BLOOD RIGHT ANTECUBITAL  Final   Special Requests   Final    BOTTLES DRAWN AEROBIC AND ANAEROBIC Blood Culture results may not be optimal due to an inadequate volume of blood received in culture bottles   Culture   Final    NO GROWTH 5 DAYS Performed at [facility], [place] [address] [place], [place] [address]    Report Status 10/09/2021 FINAL  Final  Urine Culture     Status:  Abnormal   Collection Time: 10/04/21  7:37 PM   Specimen: Urine, Clean Catch  Result Value Ref Range Status   Specimen Description URINE, CLEAN CATCH  Final   Special Requests NONE  Final   Culture (A)  Final    <10,000 COLONIES/mL INSIGNIFICANT GROWTH Performed at [facility], [place] [address], [place],  [address]    Report Status 10/05/2021 FINAL  Final     Labs: BNP (last 3 results) Recent Labs    10/03/21 1728  BNP 47.6   Basic Metabolic Panel: Recent Labs  Lab 10/04/21 1219 10/04/21 1535 10/04/21 2012 10/05/21 0654 10/05/21 0658 10/05/21 2247 10/06/21 0527  NA 135 133* 130* 131*  --   --  131*  K 4.2 4.3 4.2 3.6  --   --  3.4*  CL  --  102 97* 96*  --   --  95*  CO2  --  14* 16* 18*  --   --  17*  GLUCOSE  --  132* 166* 112*  --   --  123*  BUN  --  75* 77*  80*  --   --  85*  CREATININE  --  4.52* 4.48* 4.75*  --  5.41* 5.34*  CALCIUM  --  5.9* 6.0* 6.1* 6.1*  --  5.7*  MG  --   --   --  2.6*  --   --  2.4  PHOS  --   --   --  8.6*  --   --   --    Liver Function Tests: No results for input(s): AST, ALT, ALKPHOS, BILITOT, PROT, ALBUMIN in the last 168 hours. No results for input(s): LIPASE, AMYLASE in the last 168 hours. No results for input(s): AMMONIA in the last 168 hours. CBC: Recent Labs  Lab 10/04/21 1219 10/05/21 0654 10/06/21 0527  WBC  --  18.8* 14.1*  NEUTROABS  --   --  11.7*  HGB 9.2* 9.0* 9.2*  HCT 27.0* 26.8* 27.0*  MCV  --  91.5 92.2  PLT  --  144* 136*   Cardiac Enzymes: Recent Labs  Lab 10/04/21 1535 10/04/21 2012  CKTOTAL 1,581* 1,525*   BNP: Invalid input(s): POCBNP CBG: Recent Labs  Lab 10/04/21 2309 10/05/21 0403 10/05/21 0708 10/05/21 1107 10/05/21 1538  GLUCAP 170* 129* 119* 147* 139*   D-Dimer No results for input(s): DDIMER in the last 72 hours. Hgb A1c No results for input(s): HGBA1C in the last 72 hours. Lipid Profile No results for input(s): CHOL, HDL, LDLCALC, TRIG, CHOLHDL, LDLDIRECT in  the last 72 hours. Thyroid function studies No results for input(s): TSH, T4TOTAL, T3FREE, THYROIDAB in the last 72 hours.  Invalid input(s): FREET3 Anemia work up No results for input(s): VITAMINB12, FOLATE, FERRITIN, TIBC, IRON, RETICCTPCT in the last 72 hours. Urinalysis    Component Value Date/Time   COLORURINE AMBER (A) 10/04/2021 0500   APPEARANCEUR CLOUDY (A) 10/04/2021 0500   APPEARANCEUR Clear 07/12/2014 1012   LABSPEC 1.013 10/04/2021 0500   LABSPEC 1.018 07/12/2014 1012   PHURINE 5.0 10/04/2021 0500   GLUCOSEU 50 (A) 10/04/2021 0500   GLUCOSEU >=500 07/12/2014 1012   HGBUR MODERATE (A) 10/04/2021 0500   BILIRUBINUR MODERATE (A) 10/04/2021 0500   BILIRUBINUR Negative 07/12/2014 1012   KETONESUR NEGATIVE 10/04/2021 0500   PROTEINUR 30 (A) 10/04/2021 0500   NITRITE POSITIVE (A) 10/04/2021 0500   LEUKOCYTESUR SMALL (A) 10/04/2021 0500   LEUKOCYTESUR Negative 07/12/2014 1012   Sepsis Labs Invalid input(s): PROCALCITONIN,  WBC,  LACTICIDVEN Microbiology Recent Results (from the past 240 hour(s))  Resp Panel by RT-PCR (Flu A&B, Covid) Nasopharyngeal Swab     Status: None   Collection Time: 10/03/21 10:31 PM   Specimen: Nasopharyngeal Swab; Nasopharyngeal(NP) swabs in vial transport medium  Result Value Ref Range Status   SARS Coronavirus 2 by RT PCR NEGATIVE NEGATIVE Final    Comment: (NOTE) SARS-CoV-2 target nucleic acids are NOT DETECTED.  The SARS-CoV-2 RNA is generally detectable in upper respiratory specimens during the acute phase of infection. The lowest concentration of SARS-CoV-2 viral copies this assay can detect is 138 copies/mL. A negative result does not preclude SARS-Cov-2 infection and should not be used as the sole basis for treatment or other patient management decisions. A negative result may occur with  improper specimen collection/handling, submission of specimen other than nasopharyngeal swab, presence of viral mutation(s) within the areas  targeted by this assay, and inadequate number of viral copies(<138 copies/mL). A negative result must be combined with clinical observations, patient history, and epidemiological information. The expected result is Negative.  Fact Sheet for Patients:  EntrepreneurPulse.com.au  Fact Sheet for Healthcare Providers:  IncredibleEmployment.be  This test is no t yet approved or cleared by the [place] FDA and  has been authorized for detection and/or diagnosis of SARS-CoV-2 by FDA under an Emergency Use Authorization (EUA). This EUA will remain  in effect (meaning this test can be used) for the duration of the COVID-19 declaration under Section 564(b)(1) of the Act, 21 U.S.C.section 360bbb-3(b)(1), unless the authorization is terminated  or revoked sooner.       Influenza A by PCR NEGATIVE NEGATIVE Final   Influenza B by PCR NEGATIVE NEGATIVE Final    Comment: (NOTE) The Xpert Xpress SARS-CoV-2/FLU/RSV plus assay is intended as an aid in the diagnosis of influenza from Nasopharyngeal swab specimens and should not be used as a sole basis for treatment. Nasal washings and aspirates are unacceptable for Xpert Xpress SARS-CoV-2/FLU/RSV testing.  Fact Sheet for Patients: EntrepreneurPulse.com.au  Fact Sheet for Healthcare Providers: IncredibleEmployment.be  This test is not yet approved or cleared by the [place] FDA and has been authorized for detection and/or diagnosis of SARS-CoV-2 by FDA under an Emergency Use Authorization (EUA). This EUA will remain in effect (meaning this test can be used) for the duration of the COVID-19 declaration under Section 564(b)(1) of the Act, 21 U.S.C. section 360bbb-3(b)(1), unless the authorization is terminated or revoked.  Performed at [facility], [address] [place], [place] [address]   MRSA Next Gen by PCR, Nasal     Status: None   Collection  Time: 10/04/21  1:54 AM   Specimen: Nasal Mucosa; Nasal Swab  Result Value Ref Range Status   MRSA by PCR Next Gen NOT DETECTED NOT DETECTED Final    Comment: (NOTE) The GeneXpert MRSA Assay (FDA approved for NASAL specimens only), is one component of a comprehensive MRSA colonization surveillance program. It is not intended to diagnose MRSA infection nor to guide or monitor treatment for MRSA infections. Test performance is not FDA approved in patients less than 56 years old. Performed at [facility], [place] [address] [place], [place] [address]   Culture, blood (routine x 2)     Status: None   Collection Time: 10/04/21  9:17 AM   Specimen: BLOOD RIGHT HAND  Result Value Ref Range Status   Specimen Description BLOOD RIGHT HAND  Final   Special Requests   Final    BOTTLES DRAWN AEROBIC ONLY Blood Culture results may not be optimal due to an inadequate volume of blood received in culture bottles   Culture   Final    NO GROWTH 5 DAYS Performed at [facility], [place] [address], [place], [place] [address]    Report Status 10/09/2021 FINAL  Final  Culture, blood (routine x 2)     Status: None   Collection Time: 10/04/21 11:45 AM   Specimen: BLOOD  Result Value Ref Range Status   Specimen Description BLOOD RIGHT ANTECUBITAL  Final   Special Requests   Final    BOTTLES DRAWN AEROBIC AND ANAEROBIC Blood Culture results may not be optimal due to an inadequate volume of blood received in culture bottles   Culture   Final    NO GROWTH 5 DAYS Performed at [facility], [place] [address], [place], [place] [address]    Report Status 10/09/2021 FINAL  Final  Urine Culture     Status: Abnormal   Collection Time: 10/04/21  7:37 PM   Specimen: Urine, Clean Catch  Result  Value Ref Range Status   Specimen Description URINE, CLEAN CATCH  Final   Special Requests NONE  Final   Culture (A)  Final    <10,000 COLONIES/mL INSIGNIFICANT GROWTH Performed at [facility], [address], [place], [place] [address]    Report Status 10/05/2021 FINAL  Final     Time coordinating discharge: Over 30 minutes  SIGNED:   Charlynne Cousins, MD  [facility] 10/11/2021, 9:49 AM Pager   If 7PM-7AM, please contact night-coverage [email or web address] Password [email or web address]

## 2021-10-11 NOTE — Progress Notes (Signed)
Manufacturing engineer [facility]) Hospital Liaison note.    Chart reviewed and eligibility confirmed for [facility]. Spoke with family to confirm interest and explain services. Family agreeable to transfer today. TOC aware.    [facility] will notify TOC when registration paperwork has been completed to arrange transport.   RN please call report to [phone].  Thank you for the opportunity to participate in this patient's care.  Domenic Moras, BSN, RN [facility] Liaison (listed on [place] under [facility])    [phone] (24h on call)

## 2021-10-11 NOTE — Progress Notes (Signed)
Called report to Hospice home and spoke to [facility]. Pt will leave with IV and condom cath in place. Family is aware of the transport, waiting on [facility].

## 2021-10-26 DEATH — deceased
